# Patient Record
Sex: Male | Born: 1960
Health system: Southern US, Community
[De-identification: ages and names within clinical notes are randomized; demographics above are authoritative.]

## PROBLEM LIST (undated history)

## (undated) DIAGNOSIS — G4733 Obstructive sleep apnea (adult) (pediatric): Secondary | ICD-10-CM

## (undated) DIAGNOSIS — E785 Hyperlipidemia, unspecified: Secondary | ICD-10-CM

## (undated) DIAGNOSIS — I10 Essential (primary) hypertension: Secondary | ICD-10-CM

## (undated) DIAGNOSIS — Z8249 Family history of ischemic heart disease and other diseases of the circulatory system: Secondary | ICD-10-CM

## (undated) DIAGNOSIS — E669 Obesity, unspecified: Secondary | ICD-10-CM

## (undated) HISTORY — DX: Family history of ischemic heart disease and other diseases of the circulatory system: Z82.49

## (undated) HISTORY — DX: Hyperlipidemia, unspecified: E78.5

## (undated) HISTORY — DX: Obstructive sleep apnea (adult) (pediatric): G47.33

## (undated) HISTORY — DX: Obesity, unspecified: E66.9

## (undated) HISTORY — DX: Essential (primary) hypertension: I10

---

## 2010-11-30 HISTORY — PX: CARDIOVASCULAR STRESS TEST: SHX262

## 2010-11-30 HISTORY — PX: TRANSTHORACIC ECHOCARDIOGRAM: SHX275

## 2011-01-14 HISTORY — PX: OTHER SURGICAL HISTORY: SHX169

## 2012-09-27 ENCOUNTER — Ambulatory Visit (INDEPENDENT_AMBULATORY_CARE_PROVIDER_SITE_OTHER): Payer: BC Managed Care – PPO | Admitting: Cardiovascular Disease

## 2012-09-27 ENCOUNTER — Encounter: Payer: Self-pay | Admitting: Cardiovascular Disease

## 2012-09-27 VITALS — BP 110/70 | HR 79 | Ht 71.0 in | Wt 211.4 lb

## 2012-09-27 DIAGNOSIS — I10 Essential (primary) hypertension: Secondary | ICD-10-CM

## 2012-09-27 DIAGNOSIS — E782 Mixed hyperlipidemia: Secondary | ICD-10-CM

## 2012-09-27 DIAGNOSIS — R5381 Other malaise: Secondary | ICD-10-CM

## 2012-09-27 DIAGNOSIS — K219 Gastro-esophageal reflux disease without esophagitis: Secondary | ICD-10-CM

## 2012-09-27 DIAGNOSIS — R5383 Other fatigue: Secondary | ICD-10-CM

## 2012-09-27 DIAGNOSIS — G4733 Obstructive sleep apnea (adult) (pediatric): Secondary | ICD-10-CM

## 2012-09-27 LAB — COMPREHENSIVE METABOLIC PANEL
ALT: 14 U/L (ref 0–53)
Albumin: 4.4 g/dL (ref 3.5–5.2)
Alkaline Phosphatase: 48 U/L (ref 39–117)
Glucose, Bld: 94 mg/dL (ref 70–99)
Potassium: 4.8 mEq/L (ref 3.5–5.3)
Sodium: 139 mEq/L (ref 135–145)
Total Bilirubin: 0.7 mg/dL (ref 0.3–1.2)
Total Protein: 7.2 g/dL (ref 6.0–8.3)

## 2012-09-27 LAB — CBC
Hemoglobin: 13.9 g/dL (ref 13.0–17.0)
MCHC: 34.3 g/dL (ref 30.0–36.0)
Platelets: 276 10*3/uL (ref 150–400)
RBC: 4.52 MIL/uL (ref 4.22–5.81)

## 2012-09-27 LAB — T3, FREE: T3, Free: 3.2 pg/mL (ref 2.3–4.2)

## 2012-09-27 MED ORDER — IRBESARTAN 150 MG PO TABS: 150.0000 mg | ORAL_TABLET | Freq: Every day | ORAL | Status: DC

## 2012-09-27 NOTE — Progress Notes (Signed)
Patient ID: Brian Mcgee, male   DOB: 26-Aug-1960, 52 y.o.   MRN: [id number]     HPI: Brian Mcgee, is a 52 y.o. male who presents for a six-month cardiology evaluation.  [name] has a history of hypertension, mixed hyperlipidemia, strong family history for coronary artery disease, obstructive sleep apnea on CPAP therapy, as well as obesity. In addition, he was diagnosed with hypothyroidism earlier this year and has been on low-dose Synthroid replacement. He tells me over the past several months he is not doing quite as well. 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 He did was not using CPAP at that time. He also had previously lost significant amount of weight and also stopped taking his Nexium. Socially, he has noticed some potential dyspeptic symptoms. Also does note a dry hacking cough it is uncertain if this is related to his lisinopril or not taking the proton pump inhibitor. He certainly has gained back approximately 10 of the pounds that he had lost previously lost over 25 pounds. He does note fatigue. He does have a history of hypothyroidism and has been on Synthroid. He is now back using his CPAP therapy since his return from [place]. He is sleeping better. He presents for evaluation.  History reviewed. No pertinent past medical history.  History reviewed. No pertinent past surgical history.  Allergies  Allergen Reactions  . Penicillins Rash    Current Outpatient Prescriptions  Medication Sig Dispense Refill  . aspirin 81 MG tablet Take 81 mg by mouth daily.      Marland Kitchen levothyroxine (SYNTHROID, LEVOTHROID) 50 MCG tablet Take 50 mcg by mouth daily before breakfast.      . lisinopril (PRINIVIL,ZESTRIL) 20 MG tablet Take 20 mg by mouth daily.      . niacin-simvastatin (SIMCOR) 1000-20 MG 24 hr tablet Take 1 tablet by mouth at bedtime.      . irbesartan (AVAPRO) 150 MG tablet Take 1 tablet (150 mg total) by mouth at bedtime.  30 tablet  1   No current  facility-administered medications for this visit.    History   Social History  . Marital Status: Married    Spouse Name: N/A    Number of Children: N/A  . Years of Education: N/A   Occupational History  . Not on file.   Social History Main Topics  . Smoking status: Never Smoker   . Smokeless tobacco: Never Used  . Alcohol Use: Yes     Comment: socially  . Drug Use: Not on file  . Sexual Activity: Not on file   Other Topics Concern  . Not on file   Social History Narrative  . No narrative on file    Family History  Problem Relation Age of Onset  . Heart failure Father    Social history is notable in that he is married has 2 children. Is no tobacco or alcohol use.  ROS is negative for fevers, chills or night sweats.  He denies palpitations. He denies presyncope or syncope. He denies wheezing. He does admit to GERD. He also admits to a dry cough. He denies bleeding. He denies change in bowel or bladder habits. He denies claudication. He denies myalgias. Other system review is negative.  PE BP 110/70  Pulse 79  Ht 5\' 11"  (1.803 m)  Wt 211 lb 6.4 oz (95.89 kg)  BMI 29.5 kg/m2  General: Alert, oriented, no distress.  Skin: normal turgor, no rashes HEENT: Normocephalic,  atraumatic. Pupils round and reactive; sclera anicteric;no lid lag.  Nose without nasal septal hypertrophy Mouth/Parynx benign; Mallinpatti scale 3 Neck: No JVD, no carotid briuts Lungs: clear to ausculatation and percussion; no wheezing or rales Heart: RRR, s1 s2 normal 1/6 systolic murmur. Abdomen: Mild central adiposity soft, nontender; no hepatosplenomehaly, BS+; abdominal aorta nontender and not dilated by palpation. Pulses 2+ Extremities: no clubbing cyanosis or edema, Homan's sign negative  Neurologic: grossly nonfocal  ECG: Normal sinus rhythm at 79 beats per minute. Mild RV conduction delay; normal intervals.  LABS:  BMET No results found for this basename: na, k, cl, co2, glucose, bun,  creatinine, calcium, gfrnonaa, gfraa     Hepatic Function Panel  No results found for this basename: prot, albumin, ast, alt, alkphos, bilitot, bilidir, ibili     CBC No results found for this basename: wbc, rbc, hgb, hct, plt, mcv, mch, mchc, rdw, neutrabs, lymphsabs, monoabs, eosabs, basosabs     BNP No results found for this basename: probnp    Lipid Panel  No results found for this basename: chol, trig, hdl, cholhdl, vldl, ldlcalc     RADIOLOGY: No results found.    ASSESSMENT AND PLAN: [name] is a 52 year old gentleman who has a history of hypertension, mixed hyperlipidemia, hypothyroidism, family history for coronary artery disease as well as obstructive sleep apnea. He now is feeling better since reinstituting CPAP therapy since he did not use this when he had lost weight less than 200 pounds while he was in [place]. He again notes some reflux symptoms. I suggested he resume over-the-counter Nexium. I am recommending discontinuance of his lisinopril since this may be contributing to an ACE-induced cough. I will change this to irbesartan 150 mg daily. He is fasting today and I will check a CBC, comprehensive metabolic panel, NMR profile, TSH free T4 and vitamin D level.     Lennette Bihari, MD, [facility]  09/27/2012 11:35 AM

## 2012-09-27 NOTE — Patient Instructions (Addendum)
Your physician has recommended you make the following change in your medication: STOP the lisinopril. Start new prescription for Irbesartan 150 mg . This has already been sent to your pharmacy.  Your physician recommends that you return for lab work today. Your physician recommends that you schedule a follow-up appointment in: 6 MONTHS.

## 2012-09-28 LAB — NMR LIPOPROFILE WITH LIPIDS
Cholesterol, Total: 145 mg/dL (ref <200)
HDL Size: 8.4 nm — ABNORMAL LOW (ref >=9.2)
LDL (calc): 77 mg/dL (ref <100)
LDL Particle Number: 1055 nmol/L — ABNORMAL HIGH (ref <1000)
LP-IR Score: 52 — ABNORMAL HIGH (ref <=45)
Small LDL Particle Number: 448 nmol/L (ref <=527)
Triglycerides: 105 mg/dL (ref <150)
VLDL Size: 41.9 nm (ref <=46.6)

## 2012-10-04 ENCOUNTER — Encounter: Payer: Self-pay | Admitting: *Deleted

## 2012-10-04 NOTE — Progress Notes (Signed)
Quick Note:    Letter sent to patient.  ______

## 2012-11-22 ENCOUNTER — Other Ambulatory Visit: Payer: Self-pay | Admitting: *Deleted

## 2012-11-22 MED ORDER — IRBESARTAN 150 MG PO TABS: 150.0000 mg | ORAL_TABLET | Freq: Every day | ORAL | Status: DC

## 2012-11-22 NOTE — Telephone Encounter (Signed)
Rx sent to pharmacy electronically.   

## 2012-12-06 ENCOUNTER — Other Ambulatory Visit: Payer: Self-pay

## 2013-03-25 ENCOUNTER — Encounter: Payer: Self-pay | Admitting: Cardiovascular Disease

## 2013-03-25 ENCOUNTER — Ambulatory Visit (INDEPENDENT_AMBULATORY_CARE_PROVIDER_SITE_OTHER): Payer: BC Managed Care – PPO | Admitting: Cardiovascular Disease

## 2013-03-25 VITALS — BP 150/110 | HR 81 | Ht 71.0 in | Wt 233.5 lb

## 2013-03-25 DIAGNOSIS — I10 Essential (primary) hypertension: Secondary | ICD-10-CM

## 2013-03-25 DIAGNOSIS — E782 Mixed hyperlipidemia: Secondary | ICD-10-CM

## 2013-03-25 MED ORDER — IRBESARTAN 300 MG PO TABS: 300.0000 mg | ORAL_TABLET | Freq: Every day | ORAL | Status: DC

## 2013-03-25 NOTE — Patient Instructions (Signed)
Your physician has recommended you make the following change in your medication: increase the irbesartan from 150 mg to 300 mg.  Your physician recommends that you return for lab work in: 2-3 weeks fasting.  Your physician recommends that you schedule a follow-up appointment in: 6 months.

## 2013-03-29 ENCOUNTER — Encounter: Payer: Self-pay | Admitting: Cardiovascular Disease

## 2013-03-29 NOTE — Progress Notes (Signed)
Patient ID: Jasun Gasparini, male   DOB: 09-27-1960, 53 y.o.   MRN: [id number]     HPI: Kel Senn is a 53 y.o. male who presents for a six-month cardiology evaluation. I last saw in August 2014.  [name] has a history of hypertension, mixed hyperlipidemia, strong family history for coronary artery disease, obstructive sleep apnea on CPAP therapy, as well as obesity. In addition, he was diagnosed with hypothyroidism earlier this year and has been on low-dose Synthroid replacement. He tells me over the past several months he is not doing quite as well. 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 He did was not using CPAP at that time. He also had previously lost significant amount of weight and also stopped taking his Nexium. Socially, he has noticed some potential dyspeptic symptoms. Also does note a dry hacking cough it is uncertain if this is related to his lisinopril or not taking the proton pump inhibitor. He certainly has gained back approximately 10 of the pounds that he had lost previously lost over 25 pounds. He does note fatigue. He does have a history of hypothyroidism and has been on Synthroid. He is now back using his CPAP therapy since his return from [place]. He is sleeping better.  When I saw him in all he was experiencing an ACE-induced cough and I discontinued his lisinopril and changed him to irbesartan 150 mg daily. I also suggested he resume over-the-counter Nexium.  He did have followup laboratory and NMR profile yielded LDL particle number of 1055, calculated LDL cholesterol 77, HDL cholesterol 47, triglycerides 105, and total cholesterol 145 on his Simcor 1000/20 regimen. He did have increased insulin resistance. Had normal chemistry profile. TSH was borderline increased at 5.136.  He states recently, he has been using his CPAP. He has gained weight. He has not been routinely exercising. He presents for evaluation.  Past Medical History  Diagnosis  Date  . Hypertension   . Hyperlipidemia   . Family history of heart disease   . OSA (obstructive sleep apnea)     on CPAP  . Obesity     Past Surgical History  Procedure Laterality Date  . Sleep study interpretation  01/14/2011    AHI-30.7/hr, AHI during REM-5.7/hr, RDI-30.7/hr, RDI during REM-5.7/hr, average oxygen saturation during REM and NREM-93%, lowest oxygen saturation during NREM-81%, lowest oxygen saturation during REM-85%,   . Cardiovascular stress test  11/30/2010    No scintigraphic evidence of inducible myocardial ischemia. EKG negative for ischemia. No ECG changes.  . Transthoracic echocardiogram  11/30/2010    EF >55%, normal LV systolic function, normal RV systolic function    Allergies  Allergen Reactions  . Penicillins Rash    Current Outpatient Prescriptions  Medication Sig Dispense Refill  . aspirin 81 MG tablet Take 81 mg by mouth daily.      Marland Kitchen levothyroxine (SYNTHROID, LEVOTHROID) 50 MCG tablet Take 50 mcg by mouth daily before breakfast.      . niacin-simvastatin (SIMCOR) 1000-20 MG 24 hr tablet Take 1 tablet by mouth at bedtime.      Marland Kitchen OVER THE COUNTER MEDICATION CPAP therapy      . irbesartan (AVAPRO) 300 MG tablet Take 1 tablet (300 mg total) by mouth daily.  90 tablet  3   No current facility-administered medications for this visit.    History   Social History  . Marital Status: Married    Spouse Name: N/A  Number of Children: N/A  . Years of Education: N/A   Occupational History  . Not on file.   Social History Main Topics  . Smoking status: Never Smoker   . Smokeless tobacco: Never Used  . Alcohol Use: Yes     Comment: socially  . Drug Use: Not on file  . Sexual Activity: Not on file   Other Topics Concern  . Not on file   Social History Narrative  . No narrative on file    Family History  Problem Relation Age of Onset  . Heart failure Father   . Heart disease Father     CABG  . Hypertension Father   . Diabetes Father       Type 2 diabetes  . Alzheimer's disease Mother   . Hypertension Mother   . Diabetes Mother     Type 2 diabetes  . Hypertension Sister   . Aneurysm Maternal Grandmother   . Hypertension Maternal Grandmother   . Heart disease Maternal Grandfather     CABG  . Cancer Maternal Grandfather     Colon cancer & Leukemia  . Diabetes Maternal Grandfather     Type 2 diabetes  . Hypertension Maternal Grandfather   . Hypertension Paternal Grandmother   . Diabetes Paternal Grandmother     Type 1 diabetes  . Hernia Paternal Grandmother   . Heart attack Paternal Grandmother   . Cancer Paternal Grandfather     Lung cancer   Social history is notable in that he is married has 2 children. Is no tobacco or alcohol use.  ROS is negative for fevers, chills or night sweats. There is a 22 pound weight gain from when he initially lost weight. He denies change in vision or hearing.  He denies palpitations. He denies presyncope or syncope. He denies wheezing.  Rizzo chest tightness He does admit to GERD. His dry cough improved with discontinuing his ACE inhibition and changed to ARB therapy.  He denies bleeding. He denies change in bowel or bladder habits. He denies claudication. He denies myalgias. Other  comprehensive 14 system review is negative.  PE BP 150/110  Pulse 81  Ht 5\' 11"  (1.803 m)  Wt 233 lb 8 oz (105.915 kg)  BMI 32.58 kg/m2  Repeat blood pressure 130/90  General: Alert, oriented, no distress.  Skin: normal turgor, no rashes HEENT: Normocephalic, atraumatic. Pupils round and reactive; sclera anicteric;no lid lag.  Nose without nasal septal hypertrophy Mouth/Parynx benign; Mallinpatti scale 3 Neck: No JVD, no carotid  with normal carotid upstroke  Lungs: clear to ausculatation and percussion; no wheezing or rales Heart: RRR, s1 s2 normal 1/6 systolic murmur.No diastolic murmur. No S3 or S4 gallop. No rubs thrills or heaves  Abdomen: Mild central adiposity soft, nontender; no  hepatosplenomehaly, BS+; abdominal aorta nontender and not dilated by palpation. Back: No CVA tenderness  Pulses 2+ Extremities: no clubbing cyanosis or edema, Homan's sign negative  Neurologic: grossly nonfocal Psychological: Normal affect and mood  ECG(independently read by me): Normal sinus rhythm 81 beats per minute. Normal intervals  Prior ECG from 09/27/2012: Normal sinus rhythm at 79 beats per minute. Mild RV conduction delay; normal intervals.  LABS:  BMET    Component Value Date/Time   NA 139 09/27/2012 1137     Hepatic Function Panel     Component Value Date/Time   PROT 7.2 09/27/2012 1137     CBC    Component Value Date/Time   WBC 5.4 09/27/2012 1137  BNP No results found for this basename: probnp    Lipid Panel  No results found for this basename: chol,  trig,  hdl,  cholhdl,  vldl,  ldlcalc     RADIOLOGY: No results found.    ASSESSMENT AND PLAN: Mr. Sherre ScarletHudgins is a 53 year old gentleman who has a history of hypertension, mixed hyperlipidemia, hypothyroidism, family history for coronary artery disease as well as obstructive sleep apnea. He does have documented insulin resistance and metabolic syndrome, and had lost significant weight. Unfortunately he's gained approximately 22 pounds back. He is using his CPAP therapy. His blood pressure today is elevated on his current dose of irbesartan 150 mg. I will further titrate this to 300 mg daily. He will have followup lab work in several weeks. I discussed importance of increased exercise and weight loss. He will continue to use CPAP at percent of the time. He is not having palpitations or chest pain. His GERD has improved with resumption of proton pump inhibition. There are no signs of edema. I will see him in 6 months for cardiology reevaluation.  Lennette Biharihomas A. Kelly, MD, [facility]  03/29/2013 9:04 AM

## 2013-04-09 LAB — LIPID PANEL
CHOLESTEROL: 155 mg/dL (ref 0–200)
HDL: 45 mg/dL (ref >39)
LDL CALC: 76 mg/dL (ref 0–99)
TRIGLYCERIDES: 169 mg/dL — AB (ref <150)
Total CHOL/HDL Ratio: 3.4 Ratio
VLDL: 34 mg/dL (ref 0–40)

## 2013-04-09 LAB — COMPREHENSIVE METABOLIC PANEL
ALBUMIN: 4.2 g/dL (ref 3.5–5.2)
ALT: 17 U/L (ref 0–53)
AST: 18 U/L (ref 0–37)
Alkaline Phosphatase: 56 U/L (ref 39–117)
BUN: 19 mg/dL (ref 6–23)
CALCIUM: 9.3 mg/dL (ref 8.4–10.5)
CHLORIDE: 104 meq/L (ref 96–112)
CO2: 30 mEq/L (ref 19–32)
Creat: 0.96 mg/dL (ref 0.50–1.35)
GLUCOSE: 98 mg/dL (ref 70–99)
POTASSIUM: 4.1 meq/L (ref 3.5–5.3)
Sodium: 144 mEq/L (ref 135–145)
Total Bilirubin: 0.7 mg/dL (ref 0.2–1.2)
Total Protein: 6.9 g/dL (ref 6.0–8.3)

## 2013-04-10 LAB — TSH: TSH: 10.632 u[IU]/mL — ABNORMAL HIGH (ref 0.350–4.500)

## 2013-04-17 ENCOUNTER — Telehealth: Payer: Self-pay | Admitting: Cardiovascular Disease

## 2013-04-17 NOTE — Telephone Encounter (Signed)
Patient was sen today. Results were discussed @ visit.

## 2013-04-17 NOTE — Telephone Encounter (Signed)
Results have not been reviewed by MD.  Message forwarded to Dr. Pierre BaliKelly/Wanda, CMA.

## 2013-04-17 NOTE — Telephone Encounter (Signed)
Please call,wants to discuss his lab results that he saw on my-chart.

## 2013-04-26 NOTE — Telephone Encounter (Signed)
Returned a call to patient. Discussed his TSH results. Patient says that he saw it in my chart but he did not see Dr. Landry DykeKelly's recommendations. iverbally gave patient the results and recommendations. He will use the levothyroxine that he has on hand to equal the increased dose.  He will call when he  needs a new prescription. Patient requests a testosterone level as well due to fatique. I explained to him that Dr. Tresa EndoKelly doesn't usually follow this if it is abnormal. I will ask Dr. Tresa EndoKelly if he will order this per patient request. If so, then I will order and send to patient. Patient is aware that Dr. Tresa EndoKelly will be out of the office until Next Wednesday.

## 2013-04-26 NOTE — Telephone Encounter (Signed)
Please call-still concerned because he still have not gotten his lab results.he saw his results in my chart,but been trying to discuss it with somebody.

## 2013-04-29 ENCOUNTER — Telehealth: Payer: Self-pay | Admitting: *Deleted

## 2013-04-29 DIAGNOSIS — E039 Hypothyroidism, unspecified: Secondary | ICD-10-CM

## 2013-04-29 NOTE — Telephone Encounter (Signed)
Lab order to have TSH drawn in for weeks sent to patient.

## 2013-04-30 ENCOUNTER — Telehealth: Payer: Self-pay | Admitting: Cardiovascular Disease

## 2013-04-30 NOTE — Telephone Encounter (Signed)
Need his prescription faxed or sent by e-script for his Irbesartan 300 mg #90 and 3 refills please.[facility]

## 2013-04-30 NOTE — Telephone Encounter (Signed)
Pt. Was given paper copy of prescription to [facility] into [facility] himself, per patient request. Patient was confused on why he had not received his medication. Patient was informed he had to mail the prescription to [facility] in order to receive the medication. Patient will take printed prescription to pharmacy to be filled.

## 2013-05-28 LAB — TSH: TSH: 8.524 u[IU]/mL — ABNORMAL HIGH (ref 0.350–4.500)

## 2013-05-29 ENCOUNTER — Encounter: Payer: Self-pay | Admitting: *Deleted

## 2013-06-04 ENCOUNTER — Telehealth: Payer: Self-pay | Admitting: Cardiovascular Disease

## 2013-06-04 ENCOUNTER — Other Ambulatory Visit: Payer: Self-pay | Admitting: *Deleted

## 2013-06-04 MED ORDER — LEVOTHYROXINE SODIUM 88 MCG PO TABS: 88.0000 ug | ORAL_TABLET | Freq: Every day | ORAL | Status: DC

## 2013-06-04 MED ORDER — LEVOTHYROXINE SODIUM 88 MCG PO TABS
88.0000 ug | ORAL_TABLET | Freq: Every day | ORAL | Status: DC
Start: 2013-06-04 — End: 2013-06-04

## 2013-06-04 NOTE — Telephone Encounter (Signed)
Returned call and pt verified x 2.  Pt informed message received and RN reviewed chart.  Informed his note to Burna MortimerWanda stated he was taking 75 mcg daily and informed Dr. Tresa EndoKelly wants to increase to 88 mcg (0.088 mg) daily.  Informed RN will send in script.  [facility]  Rx sent to pharmacy w/ one refill.   Pt informed Burna MortimerWanda will be notified to discuss w/ Dr. Tresa EndoKelly if/when lab to be repeated.  Pt verbalized understanding and agreed w/ plan.    Message forwarded to [place], [place].

## 2013-06-04 NOTE — Telephone Encounter (Signed)
Call to pharmacy and informed script not on file.  VO given to Tammy, pharmacist.    Call to pt and informed script was sent w/ confirmation, but pharmacy said they didn't receive it.  Informed VO given and it should be ready soon.  Pt verbalized understanding and agreed w/ plan.

## 2013-06-04 NOTE — Telephone Encounter (Signed)
Thanks. No I had not seen his message due to being in clinic the last 4 days. I will call him in reference to his follow up lab.

## 2013-06-04 NOTE — Telephone Encounter (Signed)
He had e mailed Brian Mcgee a response regarding her email regarding increasing dose of levothyrox  And he has not heard back from her since 4/29  Only has 1 day left.  Needs this refilled (and/or increased per Dr Tresa EndoKelly)  Please call

## 2013-06-04 NOTE — Telephone Encounter (Signed)
Brian Mcgee is at the pharmacy and they are saying that they did not receive the prescription refill . Where it shows that receipt confirm by pharmacy... Please call the pharmacy .Marland Kitchen. [phone]   Thanks

## 2013-06-14 ENCOUNTER — Other Ambulatory Visit: Payer: Self-pay | Admitting: Cardiovascular Disease

## 2013-06-14 NOTE — Telephone Encounter (Signed)
Rx refill sent to patient pharmacy   

## 2013-07-16 ENCOUNTER — Other Ambulatory Visit: Payer: Self-pay | Admitting: *Deleted

## 2013-07-16 ENCOUNTER — Other Ambulatory Visit: Payer: Self-pay

## 2013-07-16 ENCOUNTER — Telehealth: Payer: Self-pay | Admitting: Cardiovascular Disease

## 2013-07-16 MED ORDER — IRBESARTAN 300 MG PO TABS: 300.0000 mg | ORAL_TABLET | Freq: Every day | ORAL | Status: DC

## 2013-07-16 NOTE — Telephone Encounter (Signed)
Pt med refilled 

## 2013-07-16 NOTE — Telephone Encounter (Signed)
Rx was sent to pharmacy electronically. 

## 2013-07-16 NOTE — Telephone Encounter (Signed)
Refill complete 

## 2013-07-16 NOTE — Telephone Encounter (Signed)
Pt wants to make sure you received a fax for his generic Avapro. Please let him know if you have and please take care of this today for him if possible.

## 2013-07-23 ENCOUNTER — Telehealth: Payer: Self-pay | Admitting: Cardiovascular Disease

## 2013-07-23 ENCOUNTER — Other Ambulatory Visit: Payer: Self-pay | Admitting: *Deleted

## 2013-07-23 MED ORDER — LEVOTHYROXINE SODIUM 88 MCG PO TABS: 88.0000 ug | ORAL_TABLET | Freq: Every day | ORAL | Status: DC

## 2013-07-23 NOTE — Telephone Encounter (Signed)
Rx refill sent to patient pharmacy electronically  

## 2013-07-23 NOTE — Telephone Encounter (Signed)
Need refill on his Levothyroxine 88 micrigrams #30.

## 2013-07-24 ENCOUNTER — Telehealth: Payer: Self-pay | Admitting: Cardiovascular Disease

## 2013-07-24 ENCOUNTER — Other Ambulatory Visit: Payer: Self-pay | Admitting: *Deleted

## 2013-07-24 MED ORDER — LEVOTHYROXINE SODIUM 88 MCG PO TABS: 88.0000 ug | ORAL_TABLET | Freq: Every day | ORAL | Status: DC

## 2013-07-24 NOTE — Telephone Encounter (Signed)
Rx was sent to pharmacy electronically. 

## 2013-07-24 NOTE — Telephone Encounter (Signed)
Pt just called and said he already cancelled the order at [facility].

## 2013-07-24 NOTE — Telephone Encounter (Signed)
His Levothyroxine was [facility] by mistake.Brian Mcgee. He need you to call and cancel that first,can not get it until this happen.Please take care of this today,pt is going out of town tomorrow. Please [phone]

## 2013-08-22 ENCOUNTER — Other Ambulatory Visit: Payer: Self-pay

## 2013-08-22 MED ORDER — LEVOTHYROXINE SODIUM 88 MCG PO TABS: 88.0000 ug | ORAL_TABLET | Freq: Every day | ORAL | Status: DC

## 2013-08-22 NOTE — Telephone Encounter (Signed)
Rx was sent to pharmacy electronically. 

## 2014-01-21 ENCOUNTER — Encounter: Payer: Self-pay | Admitting: Cardiovascular Disease

## 2014-02-24 ENCOUNTER — Ambulatory Visit: Payer: BC Managed Care – PPO | Admitting: Cardiovascular Disease

## 2014-03-06 ENCOUNTER — Telehealth: Payer: Self-pay | Admitting: Cardiovascular Disease

## 2014-03-06 NOTE — Telephone Encounter (Signed)
Confirmed appt for 03/21/14 as reschedule for inclement weather cancellation.  Pt wanted to know if endocrinology referral would be appropriate for his thyroid management. TSH drawn in April 2015 was elevated, I do not see more recent results for this though.   Currently on 88mcg Synthroid daily.  Questions for Dr. Tresa EndoKelly: Does patient need pre-OV labwork ordered for upcoming appt here? Do you want to refer to endocrinology?

## 2014-03-06 NOTE — Telephone Encounter (Signed)
Mr.Hargett was schedule to come in to see Dr. Tresa EndoKelly on 02/24/14 , but the office was closed due to inclement weather , Mr Baruch GoutyHudgens is concerned that Dr. Tresa EndoKelly wanted to change his thyroid medication . Please call    Thanks

## 2014-03-07 NOTE — Telephone Encounter (Signed)
Can assess when pt is seen in office and f/u labs are obtained

## 2014-03-10 MED ORDER — LEVOTHYROXINE SODIUM 88 MCG PO TABS: 88.0000 ug | ORAL_TABLET | Freq: Every day | ORAL | Status: DC

## 2014-03-10 NOTE — Telephone Encounter (Signed)
Pt voiced understanding, requested reorder for synthroid, 30 day supply sent to pharmacy of preference.

## 2014-03-21 ENCOUNTER — Ambulatory Visit (INDEPENDENT_AMBULATORY_CARE_PROVIDER_SITE_OTHER): Payer: BLUE CROSS/BLUE SHIELD | Admitting: Cardiovascular Disease

## 2014-03-21 VITALS — BP 130/100 | HR 69 | Ht 71.0 in | Wt 228.5 lb

## 2014-03-21 DIAGNOSIS — I10 Essential (primary) hypertension: Secondary | ICD-10-CM

## 2014-03-21 DIAGNOSIS — G4733 Obstructive sleep apnea (adult) (pediatric): Secondary | ICD-10-CM

## 2014-03-21 DIAGNOSIS — E039 Hypothyroidism, unspecified: Secondary | ICD-10-CM

## 2014-03-21 DIAGNOSIS — E785 Hyperlipidemia, unspecified: Secondary | ICD-10-CM

## 2014-03-21 DIAGNOSIS — K219 Gastro-esophageal reflux disease without esophagitis: Secondary | ICD-10-CM

## 2014-03-21 DIAGNOSIS — Z9989 Dependence on other enabling machines and devices: Secondary | ICD-10-CM

## 2014-03-21 DIAGNOSIS — Z79899 Other long term (current) drug therapy: Secondary | ICD-10-CM

## 2014-03-21 DIAGNOSIS — E782 Mixed hyperlipidemia: Secondary | ICD-10-CM

## 2014-03-21 NOTE — Patient Instructions (Addendum)
Your physician wants you to follow-up in 6 months with Dr. Tresa EndoKelly. You will receive a reminder letter in the mail 2 months in advance. If you do not receive a letter, please call our office to schedule the follow-up appointment.  Dr. Tresa EndoKelly has ordered for you to have lab work done in the near future and you MUST be FASTING.

## 2014-03-23 ENCOUNTER — Encounter: Payer: Self-pay | Admitting: Cardiovascular Disease

## 2014-03-23 NOTE — Progress Notes (Signed)
Patient ID: Brian Mcgee, male   DOB: 02-02-1960, 54 y.o.   MRN: [id number]     HPI: Brian Mcgee is a 55 y.o. male who presents for a one year cardiology evaluation.   [name] has a history of hypertension, mixed hyperlipidemia, strong family history for coronary artery disease, obstructive sleep apnea on CPAP therapy, as well as obesity. In addition, he was diagnosed with hypothyroidism last year and has been on  Synthroid replacement.  He developed an ACE-induced cough and lisinopril and changed him to irbesartan. An NMR profile last year yielded LDL particle number of 1055, calculated LDL cholesterol 77, HDL cholesterol 47, triglycerides 105, and total cholesterol 145 on his Simcor 1000/20 regimen. He did have increased insulin resistance. Had normal chemistry profile. TSH was borderline increased at 5.136.  Over the past year, he admits that he is not sleeping very well.  He is an Agricultural consultant.  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  He has been under significant increased stress.  He admits to using CPAP therapy.  Remotely, he had lost a significant bout of weight but over the past year, his weight had risen to 245 pounds.  Since that level.  He has lost weight 228 pounds.  He has not had any recent laboratory assessed. He takes Nexium for GERD.  He currently is on irbesartan 300 mg daily for hypertension.Marland Kitchen He continues to be on Simcor 1000/20 for his mixed hyperlipidemia.  He has been on levothyroxine 88 g for hypothyroidism.   Past Medical History  Diagnosis Date  . Hypertension   . Hyperlipidemia   . Family history of heart disease   . OSA (obstructive sleep apnea)     on CPAP  . Obesity     Past Surgical History  Procedure Laterality Date  . Sleep study interpretation  01/14/2011    AHI-30.7/hr, AHI during REM-5.7/hr, RDI-30.7/hr, RDI during REM-5.7/hr, average oxygen saturation during REM and NREM-93%, lowest oxygen saturation during NREM-81%, lowest oxygen  saturation during REM-85%,   . Cardiovascular stress test  11/30/2010    No scintigraphic evidence of inducible myocardial ischemia. EKG negative for ischemia. No ECG changes.  . Transthoracic echocardiogram  11/30/2010    EF >55%, normal LV systolic function, normal RV systolic function    Allergies  Allergen Reactions  . Penicillins Rash    Current Outpatient Prescriptions  Medication Sig Dispense Refill  . aspirin 81 MG tablet Take 81 mg by mouth daily.    Marland Kitchen esomeprazole (NEXIUM) 20 MG capsule Take 20 mg by mouth daily at 12 noon.    . irbesartan (AVAPRO) 300 MG tablet Take 1 tablet (300 mg total) by mouth daily. 90 tablet 3  . levothyroxine (SYNTHROID, LEVOTHROID) 88 MCG tablet Take 1 tablet (88 mcg total) by mouth daily before breakfast. 30 tablet 0  . OVER THE COUNTER MEDICATION CPAP therapy    . SIMCOR 1000-20 MG 24 hr tablet TAKE 1 BY MOUTH AT BEDTIME 90 tablet 2   No current facility-administered medications for this visit.    History   Social History  . Marital Status: Married    Spouse Name: N/A  . Number of Children: N/A  . Years of Education: N/A   Occupational History  . Not on file.   Social History Main Topics  . Smoking status: Never Smoker   . Smokeless tobacco: Never Used  . Alcohol Use: Yes     Comment: socially  . Drug Use: Not on file  .  Sexual Activity: Not on file   Other Topics Concern  . Not on file   Social History Narrative    Family History  Problem Relation Age of Onset  . Heart failure Father   . Heart disease Father     CABG  . Hypertension Father   . Diabetes Father     Type 2 diabetes  . Alzheimer's disease Mother   . Hypertension Mother   . Diabetes Mother     Type 2 diabetes  . Hypertension Sister   . Aneurysm Maternal Grandmother   . Hypertension Maternal Grandmother   . Heart disease Maternal Grandfather     CABG  . Cancer Maternal Grandfather     Colon cancer & Leukemia  . Diabetes Maternal Grandfather      Type 2 diabetes  . Hypertension Maternal Grandfather   . Hypertension Paternal Grandmother   . Diabetes Paternal Grandmother     Type 1 diabetes  . Hernia Paternal Grandmother   . Heart attack Paternal Grandmother   . Cancer Paternal Grandfather     Lung cancer   Social history is notable in that he is married has 2 children. Is no tobacco or alcohol use.  ROS General: Positive for weight fluctuation; No fevers, chills, or night sweats;  HEENT: Negative; No changes in vision or hearing, sinus congestion, difficulty swallowing Pulmonary: Negative; No cough, wheezing, shortness of breath, hemoptysis Cardiovascular: Negative; No chest pain, presyncope, syncope, palpitations GI: Negative; No nausea, vomiting, diarrhea, or abdominal pain GU: Negative; No dysuria, hematuria, or difficulty voiding Musculoskeletal: Negative; no myalgias, joint pain, or weakness Hematologic/Oncology: Negative; no easy bruising, bleeding Endocrine: Positive for hypothyroidism Neuro: Negative; no changes in balance, headaches Skin: Negative; No rashes or skin lesions Psychiatric: Negative; No behavioral problems, depression Sleep: Positive for obstructive sleep apnea on CPAP No snoring, daytime sleepiness, hypersomnolence, bruxism, restless legs, hypnogognic hallucinations, no cataplexy Other comprehensive 14 point system review is negative.  PE BP 130/100 mmHg  Pulse 69  Ht '5\' 11"'  (1.803 m)  Wt 228 lb 8 oz (103.647 kg)  BMI 31.88 kg/m2  Repeat blood pressure 122/80 when taken by me. General: Alert, oriented, no distress.  Skin: normal turgor, no rashes HEENT: Normocephalic, atraumatic. Pupils round and reactive; sclera anicteric;no lid lag.  Nose without nasal septal hypertrophy Mouth/Parynx benign; Mallinpatti scale 3 Neck: No JVD, no carotid  with normal carotid upstroke  Lungs: clear to ausculatation and percussion; no wheezing or rales Heart: RRR, s1 s2 normal 1/6 systolic murmur.No diastolic  murmur. No S3 or S4 gallop. No rubs thrills or heaves  Abdomen: Mild central adiposity soft, nontender; no hepatosplenomehaly, BS+; abdominal aorta nontender and not dilated by palpation. Back: No CVA tenderness  Pulses 2+ Extremities: no clubbing cyanosis or edema, Homan's sign negative  Neurologic: grossly nonfocal Psychological: Normal affect and mood  ECG(independently read by me): Normal sinus rhythm with mild sinus arrhythmia.  Heart rate 69 bpm.  Normal intervals.  February 2015 ECG(independently read by me): Normal sinus rhythm 81 beats per minute. Normal intervals  Prior ECG from 09/27/2012: Normal sinus rhythm at 79 beats per minute. Mild RV conduction delay; normal intervals.  LABS:  BMET  BMP Latest Ref Rng 04/09/2013 09/27/2012  Glucose 70 - 99 mg/dL 98 94  BUN 6 - 23 mg/dL 19 13  Creatinine 0.50 - 1.35 mg/dL 0.96 1.04  Sodium 135 - 145 mEq/L 144 139  Potassium 3.5 - 5.3 mEq/L 4.1 4.8  Chloride 96 - 112 mEq/L 104 104  CO2 19 - 32 mEq/L 30 27  Calcium 8.4 - 10.5 mg/dL 9.3 9.5     Hepatic Function Panel     Component Value Date/Time   PROT 6.9 04/09/2013 1109   Hepatic Function Latest Ref Rng 04/09/2013 09/27/2012  Total Protein 6.0 - 8.3 g/dL 6.9 7.2  Albumin 3.5 - 5.2 g/dL 4.2 4.4  AST 0 - 37 U/L 18 14  ALT 0 - 53 U/L 17 14  Alk Phosphatase 39 - 117 U/L 56 48  Total Bilirubin 0.2 - 1.2 mg/dL 0.7 0.7     CBC  CBC Latest Ref Rng 09/27/2012  WBC 4.0 - 10.5 K/uL 5.4  Hemoglobin 13.0 - 17.0 g/dL 13.9  Hematocrit 39.0 - 52.0 % 40.5  Platelets 150 - 400 K/uL 276      BNP No results found for: PROBNP   Lipid Panel     Component Value Date/Time   CHOL 155 04/09/2013 1109   TRIG 169* 04/09/2013 1109   TRIG 105 09/27/2012 1137   HDL 45 04/09/2013 1109   CHOLHDL 3.4 04/09/2013 1109   VLDL 34 04/09/2013 1109   LDLCALC 76 04/09/2013 1109   LDLCALC 77 09/27/2012 1137     RADIOLOGY: No results found.    ASSESSMENT AND PLAN: [name] is a  54 year old gentleman who has a history of hypertension, mixed hyperlipidemia, hypothyroidism, family history for coronary artery disease as well as obstructive sleep apnea. He does have documented insulin resistance and metabolic syndrome.  Previously he had lost a significant amount weight, but over the past year, had regained weight and now has started to lose weight again.  His blood pressure is mildly labile, but on repeat by me was normal on his current dose of irbesartan 300 mg daily.  He has not had laboratory in a year.  A complete set of blood work will be obtained including TSH, free T3, free T4, CMET, lipid panel, CBC, and vitamin D level.  Adjustments will be made to his medical regimen if necessary He continues to use CPAP therapy for his obstructive sleep apnea.  It may be worthwhile to have a new download of his device to make certain inadequate therapy is not contributing to his difficulty with sleep.  He may require additional titration of thyroid medicine.  If TSH is low.  We will contact him regarding the results.  I will see him in 6 months for reevaluation or sooner from his arise.  Time spent: 25 minutes  Troy Sine, MD, [facility]  03/23/2014 10:32 AM

## 2014-03-25 ENCOUNTER — Telehealth: Payer: Self-pay | Admitting: *Deleted

## 2014-03-25 LAB — COMPREHENSIVE METABOLIC PANEL
ALT: 14 U/L (ref 0–53)
AST: 16 U/L (ref 0–37)
Albumin: 4.1 g/dL (ref 3.5–5.2)
Alkaline Phosphatase: 53 U/L (ref 39–117)
BILIRUBIN TOTAL: 0.6 mg/dL (ref 0.2–1.2)
BUN: 15 mg/dL (ref 6–23)
CO2: 22 mEq/L (ref 19–32)
CREATININE: 0.83 mg/dL (ref 0.50–1.35)
Calcium: 9 mg/dL (ref 8.4–10.5)
Chloride: 107 mEq/L (ref 96–112)
GLUCOSE: 110 mg/dL — AB (ref 70–99)
POTASSIUM: 4.2 meq/L (ref 3.5–5.3)
Sodium: 140 mEq/L (ref 135–145)
Total Protein: 6.7 g/dL (ref 6.0–8.3)

## 2014-03-25 LAB — CBC WITH DIFFERENTIAL/PLATELET
BASOS PCT: 0 % (ref 0–1)
Basophils Absolute: 0 10*3/uL (ref 0.0–0.1)
Eosinophils Absolute: 0.1 10*3/uL (ref 0.0–0.7)
Eosinophils Relative: 2 % (ref 0–5)
HCT: 43.6 % (ref 39.0–52.0)
Hemoglobin: 14.9 g/dL (ref 13.0–17.0)
LYMPHS PCT: 41 % (ref 12–46)
Lymphs Abs: 2 10*3/uL (ref 0.7–4.0)
MCH: 30.8 pg (ref 26.0–34.0)
MCHC: 34.2 g/dL (ref 30.0–36.0)
MCV: 90.3 fL (ref 78.0–100.0)
MONO ABS: 0.4 10*3/uL (ref 0.1–1.0)
MPV: 9.7 fL (ref 8.6–12.4)
Monocytes Relative: 9 % (ref 3–12)
NEUTROS ABS: 2.3 10*3/uL (ref 1.7–7.7)
NEUTROS PCT: 48 % (ref 43–77)
PLATELETS: 250 10*3/uL (ref 150–400)
RBC: 4.83 MIL/uL (ref 4.22–5.81)
RDW: 14 % (ref 11.5–15.5)
WBC: 4.8 10*3/uL (ref 4.0–10.5)

## 2014-03-25 LAB — LIPID PANEL
CHOLESTEROL: 160 mg/dL (ref 0–200)
HDL: 48 mg/dL (ref >=40)
LDL Cholesterol: 88 mg/dL (ref 0–99)
Total CHOL/HDL Ratio: 3.3 Ratio
Triglycerides: 121 mg/dL (ref <150)
VLDL: 24 mg/dL (ref 0–40)

## 2014-03-25 LAB — T4, FREE: FREE T4: 1.09 ng/dL (ref 0.80–1.80)

## 2014-03-25 LAB — TSH: TSH: 6.743 u[IU]/mL — ABNORMAL HIGH (ref 0.350–4.500)

## 2014-03-25 LAB — T3, FREE: T3 FREE: 3.2 pg/mL (ref 2.3–4.2)

## 2014-03-25 MED ORDER — NIACIN-SIMVASTATIN ER 1000-20 MG PO TB24
ORAL_TABLET | ORAL | Status: DC
Start: 2014-03-25 — End: 2014-06-25

## 2014-03-25 MED ORDER — NIACIN-SIMVASTATIN ER 1000-20 MG PO TB24: ORAL_TABLET | ORAL | Status: DC

## 2014-03-25 NOTE — Telephone Encounter (Signed)
Spoke with pt, he is needing a temp refill at a local pharmacy for simcor. He also reports the tier level has changed with [facility]. He reports he brought something here in December but has not heard. No notations in the patient chart, will send a script to [facility] and wait to hear from the company. Pt agreed with this plan.

## 2014-03-26 LAB — VITAMIN D 25 HYDROXY (VIT D DEFICIENCY, FRACTURES): Vit D, 25-Hydroxy: 24 ng/mL — ABNORMAL LOW (ref 30–100)

## 2014-04-01 ENCOUNTER — Telehealth: Payer: Self-pay | Admitting: *Deleted

## 2014-04-01 DIAGNOSIS — E039 Hypothyroidism, unspecified: Secondary | ICD-10-CM

## 2014-04-01 DIAGNOSIS — R899 Unspecified abnormal finding in specimens from other organs, systems and tissues: Secondary | ICD-10-CM

## 2014-04-01 MED ORDER — LEVOTHYROXINE SODIUM 100 MCG PO TABS: 100.0000 ug | ORAL_TABLET | Freq: Every day | ORAL | Status: DC

## 2014-04-01 NOTE — Telephone Encounter (Signed)
-----   Message from Lennette Biharihomas A Kelly, MD sent at 03/31/2014 11:46 AM EST ----- Increase synthroid from .088 to .1 mg with TSH better but  Still slightly increased; re check in 4 weeks.

## 2014-04-01 NOTE — Telephone Encounter (Signed)
-----   Message from Thomas A Kelly, MD sent at 03/31/2014 11:46 AM EST ----- Increase synthroid from .088 to .1 mg with TSH better but  Still slightly increased; re check in 4 weeks. 

## 2014-04-01 NOTE — Telephone Encounter (Signed)
Patient notified of lab results and recommendations. [facility]. Follow up TSH ordered and slip mailed to patient.

## 2014-05-21 LAB — TSH: TSH: 6.502 u[IU]/mL — ABNORMAL HIGH (ref 0.350–4.500)

## 2014-05-27 ENCOUNTER — Telehealth: Payer: Self-pay | Admitting: *Deleted

## 2014-05-27 DIAGNOSIS — E039 Hypothyroidism, unspecified: Secondary | ICD-10-CM

## 2014-05-27 MED ORDER — LEVOTHYROXINE SODIUM 125 MCG PO TABS: 125.0000 ug | ORAL_TABLET | Freq: Every day | ORAL | Status: DC

## 2014-05-27 NOTE — Telephone Encounter (Signed)
-----   Message from Lennette Biharihomas A Kelly, MD sent at 05/26/2014  5:21 PM EDT ----- Inc synthroid to 125 ug daily if on 100 u; f/u tsh in 4 weeks

## 2014-05-27 NOTE — Telephone Encounter (Signed)
Informed patient of TSH results and recommendations. Patient voiced understanding. Repeat TSH [facility]rox[facility] .[facility] mcg [facility] [address].

## 2014-06-23 ENCOUNTER — Telehealth: Payer: Self-pay | Admitting: Cardiovascular Disease

## 2014-06-23 NOTE — Telephone Encounter (Signed)
Please call,Simcor have been discontinued.He will getting two medicine to replace this one.

## 2014-06-23 NOTE — Telephone Encounter (Signed)
Please advise for simcor replacement - simvastatin 20mg  & niaspan 1000mg  daily OK?

## 2014-06-24 NOTE — Telephone Encounter (Signed)
Pt says he was still waiting to hear something.

## 2014-06-25 MED ORDER — SIMVASTATIN 40 MG PO TABS: 40.0000 mg | ORAL_TABLET | Freq: Every day | ORAL | Status: DC

## 2014-06-25 NOTE — Telephone Encounter (Signed)
Called patient to give instruction on med change. He acknowledged understanding, requested script to be submitted to [facility]. I informed this was done. No further concerns on part of patient.

## 2014-06-25 NOTE — Telephone Encounter (Signed)
Initially try just simvastatin 40 mg

## 2014-07-01 ENCOUNTER — Other Ambulatory Visit: Payer: Self-pay | Admitting: Cardiovascular Disease

## 2014-07-02 NOTE — Telephone Encounter (Signed)
Rx(s) sent to pharmacy electronically.  

## 2014-07-23 ENCOUNTER — Telehealth: Payer: Self-pay | Admitting: Cardiovascular Disease

## 2014-07-23 DIAGNOSIS — R7989 Other specified abnormal findings of blood chemistry: Secondary | ICD-10-CM

## 2014-07-23 LAB — TSH: TSH: 2.357 u[IU]/mL (ref 0.350–4.500)

## 2014-07-23 NOTE — Telephone Encounter (Signed)
Returned call to patient TSH order put in.Advised he does not have to fast.

## 2014-07-23 NOTE — Telephone Encounter (Signed)
Pt would like for you to send a lab order downstairs today please. Pt wants to know if he needs to fast? If so,please call asap.

## 2014-08-27 ENCOUNTER — Other Ambulatory Visit: Payer: Self-pay | Admitting: *Deleted

## 2014-08-27 MED ORDER — LEVOTHYROXINE SODIUM 125 MCG PO TABS: 125.0000 ug | ORAL_TABLET | Freq: Every day | ORAL | Status: DC

## 2015-02-04 ENCOUNTER — Telehealth: Payer: Self-pay | Admitting: Cardiovascular Disease

## 2015-02-05 NOTE — Telephone Encounter (Signed)
Close encounter 

## 2015-02-26 ENCOUNTER — Ambulatory Visit (INDEPENDENT_AMBULATORY_CARE_PROVIDER_SITE_OTHER): Payer: BLUE CROSS/BLUE SHIELD | Admitting: Cardiovascular Disease

## 2015-02-26 ENCOUNTER — Encounter: Payer: Self-pay | Admitting: Cardiovascular Disease

## 2015-02-26 VITALS — BP 124/88 | HR 68 | Ht 71.0 in | Wt 233.0 lb

## 2015-02-26 DIAGNOSIS — I10 Essential (primary) hypertension: Secondary | ICD-10-CM

## 2015-02-26 DIAGNOSIS — E039 Hypothyroidism, unspecified: Secondary | ICD-10-CM | POA: Diagnosis not present

## 2015-02-26 DIAGNOSIS — Z9989 Dependence on other enabling machines and devices: Secondary | ICD-10-CM

## 2015-02-26 DIAGNOSIS — E785 Hyperlipidemia, unspecified: Secondary | ICD-10-CM | POA: Diagnosis not present

## 2015-02-26 DIAGNOSIS — G4733 Obstructive sleep apnea (adult) (pediatric): Secondary | ICD-10-CM

## 2015-02-26 DIAGNOSIS — E782 Mixed hyperlipidemia: Secondary | ICD-10-CM

## 2015-02-26 DIAGNOSIS — K219 Gastro-esophageal reflux disease without esophagitis: Secondary | ICD-10-CM

## 2015-02-26 LAB — COMPREHENSIVE METABOLIC PANEL
ALT: 19 U/L (ref 9–46)
AST: 17 U/L (ref 10–35)
Albumin: 3.9 g/dL (ref 3.6–5.1)
Alkaline Phosphatase: 57 U/L (ref 40–115)
BUN: 12 mg/dL (ref 7–25)
CHLORIDE: 105 mmol/L (ref 98–110)
CO2: 27 mmol/L (ref 20–31)
Calcium: 8.9 mg/dL (ref 8.6–10.3)
Creat: 0.94 mg/dL (ref 0.70–1.33)
Glucose, Bld: 100 mg/dL — ABNORMAL HIGH (ref 65–99)
POTASSIUM: 4.4 mmol/L (ref 3.5–5.3)
Sodium: 140 mmol/L (ref 135–146)
TOTAL PROTEIN: 6.5 g/dL (ref 6.1–8.1)
Total Bilirubin: 0.5 mg/dL (ref 0.2–1.2)

## 2015-02-26 LAB — CBC
HCT: 40.6 % (ref 39.0–52.0)
Hemoglobin: 13.8 g/dL (ref 13.0–17.0)
MCH: 29.9 pg (ref 26.0–34.0)
MCHC: 34 g/dL (ref 30.0–36.0)
MCV: 88.1 fL (ref 78.0–100.0)
MPV: 9.5 fL (ref 8.6–12.4)
Platelets: 289 10*3/uL (ref 150–400)
RBC: 4.61 MIL/uL (ref 4.22–5.81)
RDW: 13.5 % (ref 11.5–15.5)
WBC: 4.6 10*3/uL (ref 4.0–10.5)

## 2015-02-26 LAB — LIPID PANEL
CHOL/HDL RATIO: 3.7 ratio (ref <=5.0)
CHOLESTEROL: 153 mg/dL (ref 125–200)
HDL: 41 mg/dL (ref >=40)
LDL CALC: 94 mg/dL (ref <130)
TRIGLYCERIDES: 88 mg/dL (ref <150)
VLDL: 18 mg/dL (ref <30)

## 2015-02-26 LAB — TSH: TSH: 1.188 u[IU]/mL (ref 0.350–4.500)
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
## 2015-02-26 NOTE — Patient Instructions (Signed)
Your physician recommends that you return for lab work fasting.   Your physician wants you to follow-up in: 1 year or sooner if needed. You will receive a reminder letter in the mail two months in advance. If you don't receive a letter, please call our office to schedule the follow-up appointment.  If you need a refill on your cardiac medications before your next appointment, please call your pharmacy.    

## 2015-03-06 ENCOUNTER — Other Ambulatory Visit: Payer: Self-pay

## 2015-03-06 MED ORDER — LEVOTHYROXINE SODIUM 125 MCG PO TABS: 125.0000 ug | ORAL_TABLET | Freq: Every day | ORAL | Status: DC

## 2015-03-06 NOTE — Telephone Encounter (Signed)
Received rx request from [facility] for Levothroxine

## 2015-06-25 ENCOUNTER — Other Ambulatory Visit: Payer: Self-pay | Admitting: Cardiovascular Disease

## 2015-06-25 NOTE — Telephone Encounter (Signed)
Rx refill sent to pharmacy. 

## 2015-08-07 ENCOUNTER — Other Ambulatory Visit: Payer: Self-pay | Admitting: Cardiovascular Disease

## 2015-08-07 NOTE — Telephone Encounter (Signed)
Rx request sent to pharmacy.  

## 2015-08-11 ENCOUNTER — Other Ambulatory Visit: Payer: Self-pay | Admitting: *Deleted

## 2015-08-11 MED ORDER — IRBESARTAN 300 MG PO TABS: 300.0000 mg | ORAL_TABLET | Freq: Every day | ORAL | Status: DC

## 2016-01-01 ENCOUNTER — Emergency Department (HOSPITAL_BASED_OUTPATIENT_CLINIC_OR_DEPARTMENT_OTHER)
Admission: EM | Admit: 2016-01-01 | Discharge: 2016-01-01 | Disposition: A | Discharge status: Home / Self Care | Payer: BLUE CROSS/BLUE SHIELD | Attending: Emergency Medicine | Admitting: Emergency Medicine

## 2016-01-01 ENCOUNTER — Encounter (HOSPITAL_BASED_OUTPATIENT_CLINIC_OR_DEPARTMENT_OTHER): Payer: Self-pay | Admitting: *Deleted

## 2016-01-01 ENCOUNTER — Emergency Department (HOSPITAL_BASED_OUTPATIENT_CLINIC_OR_DEPARTMENT_OTHER): Payer: BLUE CROSS/BLUE SHIELD

## 2016-01-01 ENCOUNTER — Telehealth: Payer: Self-pay | Admitting: Cardiovascular Disease

## 2016-01-01 DIAGNOSIS — I10 Essential (primary) hypertension: Secondary | ICD-10-CM | POA: Insufficient documentation

## 2016-01-01 DIAGNOSIS — F4489 Other dissociative and conversion disorders: Secondary | ICD-10-CM | POA: Diagnosis not present

## 2016-01-01 DIAGNOSIS — Z79899 Other long term (current) drug therapy: Secondary | ICD-10-CM | POA: Diagnosis not present

## 2016-01-01 DIAGNOSIS — R410 Disorientation, unspecified: Secondary | ICD-10-CM | POA: Diagnosis present

## 2016-01-01 DIAGNOSIS — Z7982 Long term (current) use of aspirin: Secondary | ICD-10-CM | POA: Insufficient documentation

## 2016-01-01 LAB — URINALYSIS, ROUTINE W REFLEX MICROSCOPIC
Bilirubin Urine: NEGATIVE
GLUCOSE, UA: NEGATIVE mg/dL
HGB URINE DIPSTICK: NEGATIVE
KETONES UR: NEGATIVE mg/dL
LEUKOCYTES UA: NEGATIVE
Nitrite: NEGATIVE
PROTEIN: NEGATIVE mg/dL
Specific Gravity, Urine: 1.021 (ref 1.005–1.030)
pH: 6.5 (ref 5.0–8.0)

## 2016-01-01 LAB — COMPREHENSIVE METABOLIC PANEL
ALK PHOS: 56 U/L (ref 38–126)
ALT: 21 U/L (ref 17–63)
ANION GAP: 7 (ref 5–15)
AST: 20 U/L (ref 15–41)
Albumin: 4.1 g/dL (ref 3.5–5.0)
BILIRUBIN TOTAL: 0.9 mg/dL (ref 0.3–1.2)
BUN: 12 mg/dL (ref 6–20)
CALCIUM: 8.9 mg/dL (ref 8.9–10.3)
CO2: 27 mmol/L (ref 22–32)
Chloride: 105 mmol/L (ref 101–111)
Creatinine, Ser: 0.93 mg/dL (ref 0.61–1.24)
Glucose, Bld: 95 mg/dL (ref 65–99)
POTASSIUM: 3.9 mmol/L (ref 3.5–5.1)
Sodium: 139 mmol/L (ref 135–145)
TOTAL PROTEIN: 7.1 g/dL (ref 6.5–8.1)

## 2016-01-01 LAB — CBC WITH DIFFERENTIAL/PLATELET
BASOS PCT: 0 %
Basophils Absolute: 0 10*3/uL (ref 0.0–0.1)
Eosinophils Absolute: 0.1 10*3/uL (ref 0.0–0.7)
Eosinophils Relative: 2 %
HEMATOCRIT: 42.4 % (ref 39.0–52.0)
HEMOGLOBIN: 14.3 g/dL (ref 13.0–17.0)
LYMPHS ABS: 0.9 10*3/uL (ref 0.7–4.0)
LYMPHS PCT: 16 %
MCH: 30.6 pg (ref 26.0–34.0)
MCHC: 33.7 g/dL (ref 30.0–36.0)
MCV: 90.8 fL (ref 78.0–100.0)
MONO ABS: 0.6 10*3/uL (ref 0.1–1.0)
MONOS PCT: 11 %
NEUTROS ABS: 4 10*3/uL (ref 1.7–7.7)
Neutrophils Relative %: 71 %
Platelets: 237 10*3/uL (ref 150–400)
RBC: 4.67 MIL/uL (ref 4.22–5.81)
RDW: 12.7 % (ref 11.5–15.5)
WBC: 5.7 10*3/uL (ref 4.0–10.5)

## 2016-01-01 LAB — TROPONIN I

## 2016-01-01 LAB — CBG MONITORING, ED: Glucose-Capillary: 98 mg/dL (ref 65–99)

## 2016-01-01 NOTE — Telephone Encounter (Signed)
Received a call from patient.He stated his B/P is elevated.Stated he just don't feel right.Stated he is unable to think.Cannot focus.Stated he has never felt like this, something is wrong. No headache.No weakness in in extremities. No slurred speech.Advised he needs to go to ER.[facility] to [facility] for review.

## 2016-01-01 NOTE — ED Notes (Signed)
Pt reports feeling normal when he woke up. At 8:15 had sudden onset of "cognitive issues". Pt with difficulty describing how he is feeling. Pt states he was having issues focusing at work. Pt reports it felt strange enough to make him seek treatment even though he normally would never come to the ED.  Pt denies chest pain, SOB. Pt reports symptoms are ongoing.

## 2016-01-01 NOTE — ED Triage Notes (Signed)
When to work and was not able to focus or thinking. Foggy. States his BP was elevated. Hx of HTN. Drove himself here. Alert oriented.

## 2016-01-01 NOTE — ED Provider Notes (Signed)
[facility] Provider Note   CSN: [id number] Arrival date & time: 01/01/16  1041     History   Chief Complaint No chief complaint on file.   HPI Brian Mcgee is a 55 y.o. male.  The history is provided by the patient. No language interpreter was used.  Altered Mental Status   This is a new problem. The current episode started 3 to 5 hours ago. The problem has been gradually worsening. Associated symptoms include confusion. Pertinent negatives include no weakness. His past medical history is significant for heart disease.  Pt reports he has had difficulty concentrating.  Pt reports he feels foggy.  Pt reports when he was at work he had people tell him about things he did not remember.    Past Medical History:  Diagnosis Date  . Family history of heart disease   . Hyperlipidemia   . Hypertension   . Obesity   . OSA (obstructive sleep apnea)    on CPAP    Patient Active Problem List   Diagnosis Date Noted  . Essential hypertension 09/27/2012  . Hyperlipidemia, mixed 09/27/2012  . OSA on CPAP 09/27/2012  . GERD (gastroesophageal reflux disease) 09/27/2012    Past Surgical History:  Procedure Laterality Date  . CARDIOVASCULAR STRESS TEST  11/30/2010   No scintigraphic evidence of inducible myocardial ischemia. EKG negative for ischemia. No ECG changes.  Marland Kitchen SLEEP STUDY INTERPRETATION  01/14/2011   AHI-30.7/hr, AHI during REM-5.7/hr, RDI-30.7/hr, RDI during REM-5.7/hr, average oxygen saturation during REM and NREM-93%, lowest oxygen saturation during NREM-81%, lowest oxygen saturation during REM-85%,   . TRANSTHORACIC ECHOCARDIOGRAM  11/30/2010   EF >55%, normal LV systolic function, normal RV systolic function       Home Medications    Prior to Admission medications   Medication Sig Start Date End Date Taking? Authorizing Provider  aspirin 81 MG tablet Take 81 mg by mouth daily.    Historical Provider, MD  esomeprazole (NEXIUM) 20 MG capsule Take 20  mg by mouth daily at 12 noon.    Historical Provider, MD  irbesartan (AVAPRO) 300 MG tablet Take 1 tablet (300 mg total) by mouth daily. 08/11/15   Lennette Bihari, MD  levothyroxine (SYNTHROID) 125 MCG tablet Take 1 tablet (125 mcg total) by mouth daily before breakfast. 03/06/15   Lennette Bihari, MD  OVER THE COUNTER MEDICATION CPAP therapy    Historical Provider, MD  simvastatin (ZOCOR) 40 MG tablet TAKE 1 BY MOUTH AT BEDTIME 06/25/15   Lennette Bihari, MD    Family History Family History  Problem Relation Age of Onset  . Heart failure Father   . Heart disease Father     CABG  . Hypertension Father   . Diabetes Father     Type 2 diabetes  . Alzheimer's disease Mother   . Hypertension Mother   . Diabetes Mother     Type 2 diabetes  . Hypertension Sister   . Aneurysm Maternal Grandmother   . Hypertension Maternal Grandmother   . Heart disease Maternal Grandfather     CABG  . Cancer Maternal Grandfather     Colon cancer & Leukemia  . Diabetes Maternal Grandfather     Type 2 diabetes  . Hypertension Maternal Grandfather   . Hypertension Paternal Grandmother   . Diabetes Paternal Grandmother     Type 1 diabetes  . Hernia Paternal Grandmother   . Heart attack Paternal Grandmother   . Cancer Paternal Grandfather  Lung cancer    Social History Social History  Substance Use Topics  . Smoking status: Never Smoker  . Smokeless tobacco: Never Used  . Alcohol use Yes     Comment: socially     Allergies   Penicillins   Review of Systems Review of Systems  Neurological: Negative for weakness.  Psychiatric/Behavioral: Positive for confusion and decreased concentration.  All other systems reviewed and are negative.    Physical Exam Updated Vital Signs BP 153/94   Pulse 88   Temp 97.9 F (36.6 C) (Oral)   Resp 25   Ht 5\' 11"  (1.803 m)   Wt 105.7 kg   SpO2 98%   BMI 32.50 kg/m   Physical Exam  Constitutional: He appears well-developed and well-nourished.    HENT:  Head: Normocephalic and atraumatic.  Right Ear: External ear normal.  Left Ear: External ear normal.  Mouth/Throat: Oropharynx is clear and moist.  Eyes: Conjunctivae and EOM are normal. Pupils are equal, round, and reactive to light.  Neck: Normal range of motion.  Cardiovascular: Normal rate and regular rhythm.   Pulmonary/Chest: Effort normal.  Abdominal: Soft.  Musculoskeletal: Normal range of motion.  Neurological: He is alert.  Skin: Skin is warm.  Psychiatric: He has a normal mood and affect.  Nursing note and vitals reviewed.    ED Treatments / Results  Labs (all labs ordered are listed, but only abnormal results are displayed) Labs Reviewed  CBC WITH DIFFERENTIAL/PLATELET  COMPREHENSIVE METABOLIC PANEL  URINALYSIS, ROUTINE W REFLEX MICROSCOPIC (NOT [facility])  TROPONIN I  CBG MONITORING, ED    EKG  EKG Interpretation  Date/Time:  Friday January 01 2016 11:00:42 EST Ventricular Rate:  80 PR Interval:    QRS Duration: 102 QT Interval:  387 QTC Calculation: 447 R Axis:   3 Text Interpretation:  Sinus rhythm RSR' in V1 or V2, right VCD or RVH No previous tracing Confirmed by Anitra LauthPLUNKETT  MD, WHITNEY ([id number]) on 01/01/2016 12:13:22 PM       Radiology Ct Head Wo Contrast  Result Date: 01/01/2016 CLINICAL DATA:  Awoke confused.  No known injury. EXAM: CT HEAD WITHOUT CONTRAST TECHNIQUE: Contiguous axial images were obtained from the base of the skull through the vertex without intravenous contrast. COMPARISON:  None. FINDINGS: Brain: No acute or remote infarction, hemorrhage, hydrocephalus, extra-axial collection or mass lesion/mass effect. Vascular: No hyperdense vessel or unexpected calcification. Skull: Normal. Negative for fracture or focal lesion. Sinuses/Orbits: No acute finding. IMPRESSION: Normal head CT. Electronically Signed   By: Marnee SpringJonathon  Watts M.D.   On: 01/01/2016 13:09    Procedures Procedures (including critical care time)  Medications Ordered  in ED Medications - No data to display   Initial Impression / Assessment and Plan / ED Course  I have reviewed the triage vital signs and the nursing notes.  Pertinent labs & imaging results that were available during my care of the patient were reviewed by me and considered in my medical decision making (see chart for details).  Clinical Course     I spoke with Dr. Roxy Mannsster who advised pattern does not sound like cva.   He advised follow up.  I counseled pt.  He feels normal now.   He is advised to take asa.   He is to see Dr. Tresa EndoKelly on Monday or Tuesday for recheck.   He is to return if symptoms worsen or change.  Final Clinical Impressions(s) / ED Diagnoses   Final diagnoses:  Mental confusion  New Prescriptions New Prescriptions   No medications on file  An After Visit Summary was printed and given to the patient.   Lonia SkinnerLeslie K SentinelSofia, PA-C 01/01/16 1556    Gwyneth SproutWhitney Plunkett, MD 01/02/16 [phone]

## 2016-01-01 NOTE — ED Notes (Signed)
Patient transported to CT 

## 2016-01-01 NOTE — Telephone Encounter (Signed)
New message   169/98, 160/99  Memory issues/very hard time focusing  Feels foggy

## 2016-01-01 NOTE — ED Notes (Signed)
Dr. Anitra LauthPlunkett made aware of pt presentation.

## 2016-01-07 ENCOUNTER — Encounter: Payer: Self-pay | Admitting: Cardiovascular Disease

## 2016-01-07 ENCOUNTER — Ambulatory Visit (INDEPENDENT_AMBULATORY_CARE_PROVIDER_SITE_OTHER): Payer: BLUE CROSS/BLUE SHIELD | Admitting: Cardiovascular Disease

## 2016-01-07 VITALS — BP 162/101 | HR 73 | Ht 71.0 in | Wt 236.4 lb

## 2016-01-07 DIAGNOSIS — R946 Abnormal results of thyroid function studies: Secondary | ICD-10-CM

## 2016-01-07 DIAGNOSIS — Z79899 Other long term (current) drug therapy: Secondary | ICD-10-CM | POA: Diagnosis not present

## 2016-01-07 DIAGNOSIS — R7989 Other specified abnormal findings of blood chemistry: Secondary | ICD-10-CM

## 2016-01-07 DIAGNOSIS — E8881 Metabolic syndrome: Secondary | ICD-10-CM

## 2016-01-07 DIAGNOSIS — E782 Mixed hyperlipidemia: Secondary | ICD-10-CM | POA: Diagnosis not present

## 2016-01-07 DIAGNOSIS — R42 Dizziness and giddiness: Secondary | ICD-10-CM | POA: Diagnosis not present

## 2016-01-07 DIAGNOSIS — I10 Essential (primary) hypertension: Secondary | ICD-10-CM

## 2016-01-07 DIAGNOSIS — G4733 Obstructive sleep apnea (adult) (pediatric): Secondary | ICD-10-CM

## 2016-01-07 DIAGNOSIS — E669 Obesity, unspecified: Secondary | ICD-10-CM

## 2016-01-07 MED ORDER — SIMVASTATIN 40 MG PO TABS: 20.0000 mg | ORAL_TABLET | Freq: Every day | ORAL | 2 refills | Status: DC

## 2016-01-07 MED ORDER — AMLODIPINE BESYLATE 5 MG PO TABS: 5.0000 mg | ORAL_TABLET | Freq: Every day | ORAL | 3 refills | Status: DC

## 2016-01-07 NOTE — Patient Instructions (Signed)
Your physician has requested that you have a carotid duplex. This test is an ultrasound of the carotid arteries in your neck. It looks at blood flow through these arteries that supply the brain with blood. Allow one hour for this exam. There are no restrictions or special instructions.  Your physician recommends that you return for lab work in: FASTING.  Your physician has recommended you make the following change in your medication:   1.) start new amlodipine prescription.  2.) cut the simvastatin into 1/2. Take 20 mg daily.  Your physician recommends that you schedule a follow-up appointment in: 2-3 months.

## 2016-01-09 NOTE — Progress Notes (Signed)
Patient ID: Brian Mcgee, male   DOB: [birth year], 55 y.o.   MRN: [id number]     HPI: Brian Mcgee is a 55 y.o. male who presents for an 28 month cardiology evaluation.   Brian Mcgee has a history of hypertension, mixed hyperlipidemia, strong family history for CAD, obstructive sleep apnea on CPAP therapy, as well as obesity. He was diagnosed with hypothyroidism  and has been on  Synthroid replacement.  He developed an ACE-induced cough and lisinopril and changed him to irbesartan. An NMR profile in 2014 yielded LDL particle number of 1055, calculated LDL cholesterol 77, HDL cholesterol 47, triglycerides 105, and total cholesterol 145 on Simcor 1000/20 regimen. He did have increased insulin resistance. Had normal chemistry profile. TSH was borderline increased at 5.136.   He is an Agricultural consultant for [employer].  And I saw him last yearhe had been away from home for some duration dealing with issues resulting from [place].  He was not consistently using CPAP.  He had gained weight and was not exercising as well as he had in the past.  Over the past year, he states that he has not been using his CPAP.  He is on his way to [place] today since his daughter who lives in [place] was involved in a car accident yesterday and totaled her car.  On January 01, 2016 was evaluated after he had experienced an episode where  he could not concentrate and felt "foggy. "   A CT scan of his head was normal.He was. He denies chest pain. His blood pressure was elevated at 153/94.  His symptoms had lasted for several hours and then resolved and have not recurred. He presents today for follow-up evaluation. He denies palpitations. He denies PND, orthopnea.  He denies presyncope or syncope.  He presents for reevaluation.  Past Medical History:  Diagnosis Date  . Family history of heart disease   . Hyperlipidemia   . Hypertension   . Obesity   . OSA (obstructive sleep apnea)    on CPAP     Past Surgical History:  Procedure Laterality Date  . CARDIOVASCULAR STRESS TEST  11/30/2010   No scintigraphic evidence of inducible myocardial ischemia. EKG negative for ischemia. No ECG changes.  Marland Kitchen SLEEP STUDY INTERPRETATION  01/14/2011   AHI-30.7/hr, AHI during REM-5.7/hr, RDI-30.7/hr, RDI during REM-5.7/hr, average oxygen saturation during REM and NREM-93%, lowest oxygen saturation during NREM-81%, lowest oxygen saturation during REM-85%,   . TRANSTHORACIC ECHOCARDIOGRAM  11/30/2010   EF >55%, normal LV systolic function, normal RV systolic function    Allergies  Allergen Reactions  . Penicillins Rash    Current Outpatient Prescriptions  Medication Sig Dispense Refill  . aspirin 81 MG tablet Take 81 mg by mouth daily.    Marland Kitchen esomeprazole (NEXIUM) 20 MG capsule Take 20 mg by mouth daily at 12 noon.    . irbesartan (AVAPRO) 300 MG tablet Take 1 tablet (300 mg total) by mouth daily. 90 tablet 1  . levothyroxine (SYNTHROID) 125 MCG tablet Take 1 tablet (125 mcg total) by mouth daily before breakfast. 90 tablet 2  . OVER THE COUNTER MEDICATION CPAP therapy    . simvastatin (ZOCOR) 40 MG tablet Take 0.5 tablets (20 mg total) by mouth daily. 90 tablet 2  . amLODipine (NORVASC) 5 MG tablet Take 1 tablet (5 mg total) by mouth daily. 90 tablet 3   No current facility-administered medications for this visit.     Social History  Social History  . Marital status: Married    Spouse name: N/A  . Number of children: N/A  . Years of education: N/A   Occupational History  . Not on file.   Social History Main Topics  . Smoking status: Never Smoker  . Smokeless tobacco: Never Used  . Alcohol use Yes     Comment: socially  . Drug use: Unknown  . Sexual activity: Not on file   Other Topics Concern  . Not on file   Social History Narrative  . No narrative on file    Family History  Problem Relation Age of Onset  . Heart failure Father   . Heart disease Father     CABG  .  Hypertension Father   . Diabetes Father     Type 2 diabetes  . Alzheimer's disease Mother   . Hypertension Mother   . Diabetes Mother     Type 2 diabetes  . Hypertension Sister   . Aneurysm Maternal Grandmother   . Hypertension Maternal Grandmother   . Heart disease Maternal Grandfather     CABG  . Cancer Maternal Grandfather     Colon cancer & Leukemia  . Diabetes Maternal Grandfather     Type 2 diabetes  . Hypertension Maternal Grandfather   . Hypertension Paternal Grandmother   . Diabetes Paternal Grandmother     Type 1 diabetes  . Hernia Paternal Grandmother   . Heart attack Paternal Grandmother   . Cancer Paternal Grandfather     Lung cancer   Social history is notable in that he is married has 2 children. Is no tobacco or alcohol use.  ROS General: Positive for weight fluctuation; No fevers, chills, or night sweats;  HEENT: Negative; No changes in vision or hearing, sinus congestion, difficulty swallowing Pulmonary: Negative; No cough, wheezing, shortness of breath, hemoptysis Cardiovascular: Negative; No chest pain, presyncope, syncope, palpitations GI: Negative; No nausea, vomiting, diarrhea, or abdominal pain GU: Negative; No dysuria, hematuria, or difficulty voiding Musculoskeletal: Negative; no myalgias, joint pain, or weakness Hematologic/Oncology: Negative; no easy bruising, bleeding Endocrine: Positive for hypothyroidism Neuro: Negative; no changes in balance, headaches Skin: Negative; No rashes or skin lesions Psychiatric: Negative; No behavioral problems, depression Sleep: Positive for obstructive sleep apnea on CPAP No snoring, daytime sleepiness, hypersomnolence, bruxism, restless legs, hypnogognic hallucinations, no cataplexy Other comprehensive 14 point system review is negative.  PE BP (!) 162/101   Pulse 73   Ht _0  (1.803 m)   Wt 236 lb 6.4 oz (107.2 kg)   BMI 32.97 kg/m    Repeat blood pressure by me was 154/95  Wt Readings from Last 3  Encounters:  01/07/16 236 lb 6.4 oz (107.2 kg)  01/01/16 233 lb (105.7 kg)  02/26/15 233 lb (105.7 kg)   General: Alert, oriented, no distress.  Skin: normal turgor, no rashes HEENT: Normocephalic, atraumatic. Pupils round and reactive; sclera anicteric;no lid lag.  Nose without nasal septal hypertrophy Mouth/Parynx benign; Mallinpatti scale 3 Neck: No JVD, no carotid  with normal carotid upstroke  Lungs: clear to ausculatation and percussion; no wheezing or rales Heart: RRR, s1 s2 normal 1/6 systolic murmur.No diastolic murmur. No S3 or S4 gallop. No rubs thrills or heaves  Abdomen: Mild central adiposity soft, nontender; no hepatosplenomehaly, BS+; abdominal aorta nontender and not dilated by palpation. Back: No CVA tenderness  Pulses 2+ Extremities: no clubbing cyanosis or edema, Homan's sign negative  Neurologic: grossly nonfocal Psychological: Normal affect and mood  ECG (independently read  by me): normal sinus rhythm at 73 bpm.  Normal intervals; no significant STT  Changes.  I also personally reviewed the ECG from the emergency room taken on January 01, 2016 which showed normal sinus rhythm at 80 bpm without significant abnormality  January 2017ECG (independently read by me):   Normal sinus rhythm at 68 bpm.  Mild sinus arrhythmia. T-wave inversion in lead 3.  No ectopy.  ECG(independently read by me): Normal sinus rhythm with mild sinus arrhythmia.  Heart rate 69 bpm.  Normal intervals.  February 2015 ECG(independently read by me): Normal sinus rhythm 81 beats per minute. Normal intervals  Prior ECG from 09/27/2012: Normal sinus rhythm at 79 beats per minute. Mild RV conduction delay; normal intervals.  LABS:  BMP Latest Ref Rng & Units 01/01/2016 02/26/2015 03/25/2014  Glucose 65 - 99 mg/dL 95 100(H) 110(H)  BUN 6 - 20 mg/dL _0 Creatinine 0.61 - 1.24 mg/dL 0.93 0.94 0.83  Sodium 135 - 145 mmol/L 139 140 140  Potassium 3.5 - 5.1 mmol/L 3.9 4.4 4.2  Chloride 101 -  111 mmol/L 105 105 107  CO2 22 - 32 mmol/L _1 Calcium 8.9 - 10.3 mg/dL 8.9 8.9 9.0    Hepatic Function Latest Ref Rng & Units 01/01/2016 02/26/2015 03/25/2014  Total Protein 6.5 - 8.1 g/dL 7.1 6.5 6.7  Albumin 3.5 - 5.0 g/dL 4.1 3.9 4.1  AST 15 - 41 U/L _2 ALT 17 - 63 U/L _3 Alk Phosphatase 38 - 126 U/L 56 57 53  Total Bilirubin 0.3 - 1.2 mg/dL 0.9 0.5 0.6    CBC Latest Ref Rng & Units 01/01/2016 02/26/2015 03/25/2014  WBC 4.0 - 10.5 K/uL 5.7 4.6 4.8  Hemoglobin 13.0 - 17.0 g/dL 14.3 13.8 14.9  Hematocrit 39.0 - 52.0 % 42.4 40.6 43.6  Platelets 150 - 400 K/uL 237 289 250   Lab Results  Component Value Date   MCV 90.8 01/01/2016   MCV 88.1 02/26/2015   MCV 90.3 03/25/2014   Lab Results  Component Value Date   TSH 1.188 02/26/2015   No results found for: HGBA1C   BNP No results found for: PROBNP   Lipid Panel     Component Value Date/Time   CHOL 153 02/26/2015 1100   CHOL 145 09/27/2012 1137   TRIG 88 02/26/2015 1100   TRIG 105 09/27/2012 1137   HDL 41 02/26/2015 1100   HDL 47 09/27/2012 1137   CHOLHDL 3.7 02/26/2015 1100   VLDL 18 02/26/2015 1100   LDLCALC 94 02/26/2015 1100   LDLCALC 77 09/27/2012 1137     RADIOLOGY: No results found.    ASSESSMENT AND PLAN: Brian Mcgee is a 55 year old gentleman who has a history of hypertension, mixed hyperlipidemia, hypothyroidism, family history for CAD as well as obstructive sleep apnea. He has documented insulin resistance and metabolic syndrome.  Previously he had lost a significant amount weight, but has regained weight and now has started to lose weight again. His blood pressure today is elevated and also was increased at his emergency room evaluation despite taking irbesartan 300 mg daily  I discussed with him the fact that he has obstructive sleep apnea and has not been using CPAP therapy at this undoubtedly may be contributing to his blood pressure elevation and refractory hypertension.  I am  adding amlodipine 5 mg to his medical regimen.  I reviewed his emergency room evaluation.  He had a normal head CT.  I will also obtain carotid Doppler studies to evaluate rods and vertebral blood flow.  He continues to be on simvastatin 40 mg for hyperlipidemia.  With his molic syndrome, target LDL is less than 70.  A long discussion with him concerning the importance of resuming CPAP therapy.  We discussed its effects on hypertension, potential risk for TIA/stroke, nocturnal arrhythmias, as well as insulin resistance. The MRIs 32.97 and is consistent with obesity.  We discussed the importance of weight loss and resumption of actity.  Since I'm adding amlodipine, for now I will reduce his simvastatin to 20 mg but this may need to be changed to a more gh potency statin treatment with Crestor or Lipitor depending upon laboratory results. A complete set of blood work will be obtained. He continues to be on levothyroxine for hypothyroidism and his GERD is controlled with Nexium. I will see him in 2-3 months for reevaluation, and prior to that office visit, we will obtain a CPAP download to assess optimal treatment of his obstructive sleep apnea.    Time spent: 25 minutes  Troy Sine, MD, [facility]  01/09/2016 6:46 AM

## 2016-01-22 ENCOUNTER — Other Ambulatory Visit: Payer: Self-pay

## 2016-01-22 MED ORDER — LEVOTHYROXINE SODIUM 125 MCG PO TABS: 125.0000 ug | ORAL_TABLET | Freq: Every day | ORAL | 2 refills | Status: DC

## 2016-02-02 NOTE — Telephone Encounter (Signed)
Pt was seen in office f/u

## 2016-02-04 ENCOUNTER — Telehealth: Payer: Self-pay | Admitting: Cardiovascular Disease

## 2016-02-04 ENCOUNTER — Ambulatory Visit (HOSPITAL_COMMUNITY)
Admission: RE | Admit: 2016-02-04 | Discharge: 2016-02-04 | Disposition: A | Discharge status: Home / Self Care | Payer: BLUE CROSS/BLUE SHIELD | Source: Ambulatory Visit | Attending: Cardiology | Admitting: Cardiology

## 2016-02-04 DIAGNOSIS — R42 Dizziness and giddiness: Secondary | ICD-10-CM

## 2016-02-04 DIAGNOSIS — I6523 Occlusion and stenosis of bilateral carotid arteries: Secondary | ICD-10-CM | POA: Insufficient documentation

## 2016-02-04 LAB — COMPREHENSIVE METABOLIC PANEL
ALK PHOS: 57 U/L (ref 40–115)
ALT: 21 U/L (ref 9–46)
AST: 17 U/L (ref 10–35)
Albumin: 4.2 g/dL (ref 3.6–5.1)
BILIRUBIN TOTAL: 0.4 mg/dL (ref 0.2–1.2)
BUN: 17 mg/dL (ref 7–25)
CO2: 27 mmol/L (ref 20–31)
Calcium: 9.4 mg/dL (ref 8.6–10.3)
Chloride: 105 mmol/L (ref 98–110)
Creat: 1.15 mg/dL (ref 0.70–1.33)
GLUCOSE: 99 mg/dL (ref 65–99)
Potassium: 4.7 mmol/L (ref 3.5–5.3)
Sodium: 140 mmol/L (ref 135–146)
TOTAL PROTEIN: 6.9 g/dL (ref 6.1–8.1)

## 2016-02-04 LAB — HEMOGLOBIN A1C
HEMOGLOBIN A1C: 5.2 % (ref <5.7)
MEAN PLASMA GLUCOSE: 103 mg/dL

## 2016-02-04 LAB — TSH: TSH: 4.56 m[IU]/L — AB (ref 0.40–4.50)

## 2016-02-04 LAB — LIPID PANEL
CHOLESTEROL: 166 mg/dL (ref <200)
HDL: 50 mg/dL (ref >40)
LDL Cholesterol: 88 mg/dL (ref <100)
Total CHOL/HDL Ratio: 3.3 Ratio (ref <5.0)
Triglycerides: 138 mg/dL (ref <150)
VLDL: 28 mg/dL (ref <30)

## 2016-02-04 NOTE — Telephone Encounter (Signed)
Will you please send his lab order down to [facility] right away.He is on his way and he forgot his lab order. He has an appt up here,for a doppler right after lab work.Pt will be rushing to get up here on time.

## 2016-02-04 NOTE — Telephone Encounter (Signed)
Orders in system and should be viewable by [facility].

## 2016-03-18 ENCOUNTER — Encounter: Payer: Self-pay | Admitting: Cardiovascular Disease

## 2016-03-18 ENCOUNTER — Ambulatory Visit (INDEPENDENT_AMBULATORY_CARE_PROVIDER_SITE_OTHER): Payer: BLUE CROSS/BLUE SHIELD | Admitting: Cardiovascular Disease

## 2016-03-18 VITALS — BP 112/68 | HR 69 | Ht 70.0 in | Wt 232.6 lb

## 2016-03-18 DIAGNOSIS — Z9989 Dependence on other enabling machines and devices: Secondary | ICD-10-CM

## 2016-03-18 DIAGNOSIS — E669 Obesity, unspecified: Secondary | ICD-10-CM

## 2016-03-18 DIAGNOSIS — G4733 Obstructive sleep apnea (adult) (pediatric): Secondary | ICD-10-CM

## 2016-03-18 DIAGNOSIS — E782 Mixed hyperlipidemia: Secondary | ICD-10-CM | POA: Diagnosis not present

## 2016-03-18 DIAGNOSIS — I10 Essential (primary) hypertension: Secondary | ICD-10-CM

## 2016-03-18 DIAGNOSIS — E8881 Metabolic syndrome: Secondary | ICD-10-CM

## 2016-03-18 NOTE — Progress Notes (Signed)
Patient ID: Brian Mcgee, male   DOB: 05-03-1960, 56 y.o.   MRN: [id number]     HPI: Brian Mcgee is a 56 y.o. male who presents for a 2 month cardiology evaluation.   [name] has a history of hypertension, mixed hyperlipidemia, strong family history for CAD, obstructive sleep apnea on CPAP therapy, as well as obesity. He was diagnosed with hypothyroidism  and has been on  Synthroid replacement.  He developed an ACE-induced cough and lisinopril and changed him to irbesartan. An NMR profile in 2014 yielded LDL particle number of 1055, calculated LDL cholesterol 77, HDL cholesterol 47, triglycerides 105, and total cholesterol 145 on Simcor 1000/20 regimen. He did have increased insulin resistance. Had normal chemistry profile. TSH was borderline increased at 5.136.   He is an Agricultural consultant for [employer].  And I saw him last yearhe had been away from home for some duration dealing with issues resulting from [place].  He was not consistently using CPAP.  He had gained weight and was not exercising as well as he had in the past.  Over the past year, he states that he has not been using his CPAP.  He is on his way to [place] today since his daughter who lives in [place] was involved in a car accident yesterday and totaled her car.  On January 01, 2016 was evaluated after he had experienced an episode where  he could not concentrate and felt "foggy. "   A CT scan of his head was normal. He denied chest pain. His blood pressure was elevated at 153/94.  His symptoms had lasted for several hours and then resolved and have not recurred. I saw him for follow-up evaluation in December 2017. At  that time I aded amlodipine 5 mg to his regimen and reduced simvastatin to 20 mg.  I stressed the impotrtance of using CPAP since he had been noncompliant and reviewed the data regarding untreated OSA on cardiovascular risk.    Over the past 2 months he admits to 100% compliance with  CPAP.  He has started to lose weight and has lost 9 lbs. Carotid studies showed very mild heterogenous plaque without significant obstruction.  He feels better and presents for f/u evaluation  Past Medical History:  Diagnosis Date  . Family history of heart disease   . Hyperlipidemia   . Hypertension   . Obesity   . OSA (obstructive sleep apnea)    on CPAP    Past Surgical History:  Procedure Laterality Date  . CARDIOVASCULAR STRESS TEST  11/30/2010   No scintigraphic evidence of inducible myocardial ischemia. EKG negative for ischemia. No ECG changes.  Marland Kitchen SLEEP STUDY INTERPRETATION  01/14/2011   AHI-30.7/hr, AHI during REM-5.7/hr, RDI-30.7/hr, RDI during REM-5.7/hr, average oxygen saturation during REM and NREM-93%, lowest oxygen saturation during NREM-81%, lowest oxygen saturation during REM-85%,   . TRANSTHORACIC ECHOCARDIOGRAM  11/30/2010   EF >55%, normal LV systolic function, normal RV systolic function    Allergies  Allergen Reactions  . Penicillins Rash    Current Outpatient Prescriptions  Medication Sig Dispense Refill  . amLODipine (NORVASC) 5 MG tablet Take 1 tablet (5 mg total) by mouth daily. 90 tablet 3  . aspirin 81 MG tablet Take 81 mg by mouth daily.    Marland Kitchen esomeprazole (NEXIUM) 20 MG capsule Take 20 mg by mouth daily at 12 noon.    . irbesartan (AVAPRO) 300 MG tablet Take 1 tablet (300 mg total) by mouth  daily. 90 tablet 1  . levothyroxine (SYNTHROID) 125 MCG tablet Take 1 tablet (125 mcg total) by mouth daily before breakfast. 90 tablet 2  . OVER THE COUNTER MEDICATION CPAP therapy    . simvastatin (ZOCOR) 40 MG tablet Take 0.5 tablets (20 mg total) by mouth daily. 90 tablet 2   No current facility-administered medications for this visit.     Social History   Social History  . Marital status: Married    Spouse name: N/A  . Number of children: N/A  . Years of education: N/A   Occupational History  . Not on file.   Social History Main Topics  .  Smoking status: Never Smoker  . Smokeless tobacco: Never Used  . Alcohol use Yes     Comment: socially  . Drug use: Unknown  . Sexual activity: Not on file   Other Topics Concern  . Not on file   Social History Narrative  . No narrative on file    Family History  Problem Relation Age of Onset  . Heart failure Father   . Heart disease Father     CABG  . Hypertension Father   . Diabetes Father     Type 2 diabetes  . Alzheimer's disease Mother   . Hypertension Mother   . Diabetes Mother     Type 2 diabetes  . Hypertension Sister   . Aneurysm Maternal Grandmother   . Hypertension Maternal Grandmother   . Heart disease Maternal Grandfather     CABG  . Cancer Maternal Grandfather     Colon cancer & Leukemia  . Diabetes Maternal Grandfather     Type 2 diabetes  . Hypertension Maternal Grandfather   . Hypertension Paternal Grandmother   . Diabetes Paternal Grandmother     Type 1 diabetes  . Hernia Paternal Grandmother   . Heart attack Paternal Grandmother   . Cancer Paternal Grandfather     Lung cancer   Social history is notable in that he is married has 2 children. Is no tobacco or alcohol use.  ROS General: Positive for weight fluctuation; No fevers, chills, or night sweats;  HEENT: Negative; No changes in vision or hearing, sinus congestion, difficulty swallowing Pulmonary: Negative; No cough, wheezing, shortness of breath, hemoptysis Cardiovascular: Negative; No chest pain, presyncope, syncope, palpitations GI: Negative; No nausea, vomiting, diarrhea, or abdominal pain GU: Negative; No dysuria, hematuria, or difficulty voiding Musculoskeletal: Negative; no myalgias, joint pain, or weakness Hematologic/Oncology: Negative; no easy bruising, bleeding Endocrine: Positive for hypothyroidism Neuro: Negative; no changes in balance, headaches Skin: Negative; No rashes or skin lesions Psychiatric: Negative; No behavioral problems, depression Sleep: Positive for  obstructive sleep apnea on CPAP No snoring, daytime sleepiness, hypersomnolence, bruxism, restless legs, hypnogognic hallucinations, no cataplexy Other comprehensive 14 point system review is negative.  PE BP 112/68 (BP Location: Right Arm)   Pulse 69   Ht _0  (1.778 m)   Wt 232 lb 9.6 oz (105.5 kg)   BMI 33.37 kg/m   Repeat blood pressure by me was 110/70 supine; 112/74 standing  Wt Readings from Last 3 Encounters:  03/18/16 232 lb 9.6 oz (105.5 kg)  01/07/16 236 lb 6.4 oz (107.2 kg)  01/01/16 233 lb (105.7 kg)   General: Alert, oriented, no distress.  Skin: normal turgor, no rashes HEENT: Normocephalic, atraumatic. Pupils round and reactive; sclera anicteric;no lid lag.  Nose without nasal septal hypertrophy Mouth/Parynx benign; Mallinpatti scale 3 Neck: No JVD, no carotid  with normal carotid upstroke  Lungs: clear to ausculatation and percussion; no wheezing or rales Heart: RRR, s1 s2 normal 1/6 systolic murmur.No diastolic murmur. No S3 or S4 gallop. No rubs thrills or heaves  Abdomen: Mild central adiposity soft, nontender; no hepatosplenomehaly, BS+; abdominal aorta nontender and not dilated by palpation. Back: No CVA tenderness  Pulses 2+ Extremities: no clubbing cyanosis or edema, Homan's sign negative  Neurologic: grossly nonfocal Psychological: Normal affect and mood  ECG (independently read by me): Normal sinus rhythm with mild sinus arrhythmia.  Normal intervals.  No ST segment changes.  Early transition.  December 2017 ECG (independently read by me): normal sinus rhythm at 73 bpm.  Normal intervals; no significant STT  Changes.  I  personally reviewed the ECG from the emergency room taken on January 01, 2016 which showed normal sinus rhythm at 80 bpm without significant abnormality  January 2017ECG (independently read by me):   Normal sinus rhythm at 68 bpm.  Mild sinus arrhythmia. T-wave inversion in lead 3.  No ectopy.  ECG(independently read by me): Normal  sinus rhythm with mild sinus arrhythmia.  Heart rate 69 bpm.  Normal intervals.  February 2015 ECG(independently read by me): Normal sinus rhythm 81 beats per minute. Normal intervals  Prior ECG from 09/27/2012: Normal sinus rhythm at 79 beats per minute. Mild RV conduction delay; normal intervals.  LABS:  BMP Latest Ref Rng & Units 02/04/2016 01/01/2016 02/26/2015  Glucose 65 - 99 mg/dL 99 95 100(H)  BUN 7 - 25 mg/dL _0 Creatinine 0.70 - 1.33 mg/dL 1.15 0.93 0.94  Sodium 135 - 146 mmol/L 140 139 140  Potassium 3.5 - 5.3 mmol/L 4.7 3.9 4.4  Chloride 98 - 110 mmol/L 105 105 105  CO2 20 - 31 mmol/L _1 Calcium 8.6 - 10.3 mg/dL 9.4 8.9 8.9    Hepatic Function Latest Ref Rng & Units 02/04/2016 01/01/2016 02/26/2015  Total Protein 6.1 - 8.1 g/dL 6.9 7.1 6.5  Albumin 3.6 - 5.1 g/dL 4.2 4.1 3.9  AST 10 - 35 U/L _2 ALT 9 - 46 U/L _3 Alk Phosphatase 40 - 115 U/L 57 56 57  Total Bilirubin 0.2 - 1.2 mg/dL 0.4 0.9 0.5    CBC Latest Ref Rng & Units 01/01/2016 02/26/2015 03/25/2014  WBC 4.0 - 10.5 K/uL 5.7 4.6 4.8  Hemoglobin 13.0 - 17.0 g/dL 14.3 13.8 14.9  Hematocrit 39.0 - 52.0 % 42.4 40.6 43.6  Platelets 150 - 400 K/uL 237 289 250   Lab Results  Component Value Date   MCV 90.8 01/01/2016   MCV 88.1 02/26/2015   MCV 90.3 03/25/2014   Lab Results  Component Value Date   TSH 4.56 (H) 02/04/2016   Lab Results  Component Value Date   HGBA1C 5.2 02/04/2016     BNP No results found for: PROBNP   Lipid Panel     Component Value Date/Time   CHOL 166 02/04/2016 0929   CHOL 145 09/27/2012 1137   TRIG 138 02/04/2016 0929   TRIG 105 09/27/2012 1137   HDL 50 02/04/2016 0929   HDL 47 09/27/2012 1137   CHOLHDL 3.3 02/04/2016 0929   VLDL 28 02/04/2016 0929   LDLCALC 88 02/04/2016 0929   LDLCALC 77 09/27/2012 1137     RADIOLOGY: No results found.  IMPRESSION:  1. Essential hypertension   2. Metabolic syndrome   3. OSA on CPAP   4. Hyperlipidemia,  mixed   5. Obesity, mild  ASSESSMENT AND PLAN: [name] is a 56 year old gentleman who has a history of hypertension, mixed hyperlipidemia, hypothyroidism, family history for CAD as well as obstructive sleep apnea. He has documented insulin resistance and metabolic syndrome.  Previously he had lost a significant amount weight, but has regained weight and now has started to lose weight again. When I last saw him in f/u of his ER evaluation his  blood pressure was elevated and also was increased at his emergency room evaluation despite taking irbesartan 300 mg daily  I discussed with him  that he has obstructive sleep apnea and had not been using CPAP therapy which can contribute to his blood pressure elevation and refractory hypertension.  I added amlodipine 5 mg to his medical regimen and his BP today is now in the normal range.   He had a normal head CT.  I reviewed his carotid Doppler studies which showed mild 1-39% plaque and normal vertebral blood flow bilaterally. Since amlodipine was added, I reduced simvastatin to 20 mg for hyperlipidemia.  With his metabolic syndrome, target LDL is less than 70.  He has started to lose weight and has changed his diet. I discussed his need to increase exercise to at least 5 x week for 30 minutes.  He is noww using CPAP with 100% compliance.  We have set a target weight < obesity threshold.  I will see him in 6 months for re-evaluation.  If LDL remins > 70, I will change him to crestor or lipitor for high potency statin therapy. Time spent: 25 minutes  Troy Sine, MD, [facility]  03/19/2016 4:44 PM

## 2016-03-18 NOTE — Patient Instructions (Signed)
Your physician wants you to follow-up in: 6 months or sooner if needed. You will receive a reminder letter in the mail two months in advance. If you don't receive a letter, please call our office to schedule the follow-up appointment.   If you need a refill on your cardiac medications before your next appointment, please call your pharmacy. 

## 2016-06-16 ENCOUNTER — Telehealth: Payer: Self-pay | Admitting: Cardiovascular Disease

## 2016-06-16 MED ORDER — SIMVASTATIN 40 MG PO TABS: 20.0000 mg | ORAL_TABLET | Freq: Every day | ORAL | 3 refills | Status: DC

## 2016-06-16 NOTE — Telephone Encounter (Signed)
New message    *STAT* If patient is at the pharmacy, call can be transferred to refill team.   1. Which medications need to be refilled? (please list name of each medication and dose if known) simvastatin (ZOCOR) 40 MG tablet   2. Which pharmacy/location (including street and city if local pharmacy) is medication to be sent to? [facility]  3. Do they need a 30 day or 90 day supply? 90 day supply

## 2016-06-24 ENCOUNTER — Other Ambulatory Visit: Payer: Self-pay | Admitting: Cardiovascular Disease

## 2016-06-24 ENCOUNTER — Other Ambulatory Visit: Payer: Self-pay | Admitting: *Deleted

## 2016-06-24 MED ORDER — SIMVASTATIN 40 MG PO TABS: 40.0000 mg | ORAL_TABLET | Freq: Every day | ORAL | 1 refills | Status: DC

## 2016-06-24 MED ORDER — SIMVASTATIN 40 MG PO TABS: 20.0000 mg | ORAL_TABLET | Freq: Every day | ORAL | 1 refills | Status: DC

## 2016-06-24 NOTE — Telephone Encounter (Signed)
REFILL 

## 2016-06-24 NOTE — Telephone Encounter (Signed)
°  New Prob   *STAT* If patient is at the pharmacy, call can be transferred to refill team.   1. Which medications need to be refilled? (please list name of each medication and dose if known): Simvastatin (ZOCOR) 20 MG tablet  2. Which pharmacy/location (including street and city if local pharmacy) is medication to be sent to? [facility] on [address], [place], [place] [id number]   3. Do they need a 30 day or 90 day supply? 90 Days

## 2016-06-24 NOTE — Telephone Encounter (Signed)
Patient calling states that he would like call back once the prescription is called in. Thanks.

## 2016-06-24 NOTE — Telephone Encounter (Deleted)
°*  STAT* If patient is at the pharmacy, call can be transferred to refill team.   1. Which medications need to be refilled? (please list name of each medication and dose if known) Simvastatin 20 mg   2. Which pharmacy/location (including street and city if local pharmacy) is medication to be sent to? [facility]  [phone]  3. Do they need a 30 day or 90 day supply? 90 day    Patient states that he will out of the medication

## 2016-06-28 ENCOUNTER — Telehealth: Payer: Self-pay | Admitting: Cardiovascular Disease

## 2016-06-28 NOTE — Telephone Encounter (Signed)
New Message   PLEASE CALL PT AS SOON AS YOU FILL MEDICATION  PATIENT IS COMPLETELY OUT OF MEDICATION    *STAT* If patient is at the pharmacy, call can be transferred to refill team.   1. Which medications need to be refilled? (please list name of each medication and dose if known)  simvastatin (ZOCOR) 20 MG tablet Take 1 tablet (20 mg total) by mouth at bedtime.   2. Which pharmacy/location (including street and city if local pharmacy) is medication to be sent to? [facility] [address]   3. Do they need a 30 day or 90 day supply? 90

## 2016-06-29 NOTE — Telephone Encounter (Signed)
Spoke with pt who stated he's been trying to get his medication refill since May 17th but no one has been reaching out to him, simvastatin 40 mg was send into pt pharmacy on May 25th but he is taking 20 mg which was change by Dr Tresa EndoKelly in February, pt is very upset of the missed communication between the office and himself and the fact he have to now cut his 40 mg of simvastatin in half, I went ahead and made the change of his simvastatin in [facility] to his simvastatin medication for a 40 mg won't be send into his pharmacy at next.

## 2016-09-05 ENCOUNTER — Other Ambulatory Visit: Payer: Self-pay | Admitting: Cardiovascular Disease

## 2016-09-05 NOTE — Telephone Encounter (Signed)
REFILL 

## 2016-11-22 ENCOUNTER — Other Ambulatory Visit: Payer: Self-pay | Admitting: Cardiovascular Disease

## 2016-11-22 NOTE — Telephone Encounter (Signed)
REFILL 

## 2017-02-03 ENCOUNTER — Other Ambulatory Visit: Payer: Self-pay | Admitting: Cardiovascular Disease

## 2017-03-13 ENCOUNTER — Other Ambulatory Visit: Payer: Self-pay | Admitting: Cardiovascular Disease

## 2017-03-13 NOTE — Telephone Encounter (Signed)
REFILL 

## 2017-03-31 DIAGNOSIS — L300 Nummular dermatitis: Secondary | ICD-10-CM | POA: Diagnosis not present

## 2017-03-31 DIAGNOSIS — L738 Other specified follicular disorders: Secondary | ICD-10-CM | POA: Diagnosis not present

## 2017-04-24 ENCOUNTER — Ambulatory Visit: Payer: BLUE CROSS/BLUE SHIELD | Admitting: Cardiovascular Disease

## 2017-04-24 ENCOUNTER — Encounter: Payer: Self-pay | Admitting: Cardiovascular Disease

## 2017-04-24 VITALS — BP 128/82 | HR 70 | Ht 70.0 in | Wt 244.4 lb

## 2017-04-24 DIAGNOSIS — Z79899 Other long term (current) drug therapy: Secondary | ICD-10-CM | POA: Diagnosis not present

## 2017-04-24 DIAGNOSIS — E8881 Metabolic syndrome: Secondary | ICD-10-CM

## 2017-04-24 DIAGNOSIS — Z9989 Dependence on other enabling machines and devices: Secondary | ICD-10-CM | POA: Diagnosis not present

## 2017-04-24 DIAGNOSIS — G4733 Obstructive sleep apnea (adult) (pediatric): Secondary | ICD-10-CM | POA: Diagnosis not present

## 2017-04-24 DIAGNOSIS — E782 Mixed hyperlipidemia: Secondary | ICD-10-CM | POA: Diagnosis not present

## 2017-04-24 DIAGNOSIS — I10 Essential (primary) hypertension: Secondary | ICD-10-CM | POA: Diagnosis not present

## 2017-04-24 DIAGNOSIS — E039 Hypothyroidism, unspecified: Secondary | ICD-10-CM | POA: Diagnosis not present

## 2017-04-24 DIAGNOSIS — R7989 Other specified abnormal findings of blood chemistry: Secondary | ICD-10-CM | POA: Diagnosis not present
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
## 2017-04-24 NOTE — Patient Instructions (Signed)
Medication Instructions:  Your physician recommends that you continue on your current medications as directed. Please refer to the Current Medication list given to you today.  Labwork: Today (CMET, CBC, Lipid, TSH)  Follow-Up: Your physician wants you to follow-up in: 12 months with Dr. Kelly.  You will receive a reminder letter in the mail two months in advance. If you don't receive a letter, please call our office to schedule the follow-up appointment.   Any Other Special Instructions Will Be Listed Below (If Applicable).     If you need a refill on your cardiac medications before your next appointment, please call your pharmacy.   

## 2017-04-25 ENCOUNTER — Encounter: Payer: Self-pay | Admitting: Cardiovascular Disease

## 2017-04-25 LAB — COMPREHENSIVE METABOLIC PANEL
ALK PHOS: 74 IU/L (ref 39–117)
ALT: 24 IU/L (ref 0–44)
AST: 31 IU/L (ref 0–40)
Albumin/Globulin Ratio: 1.8 (ref 1.2–2.2)
Albumin: 4.4 g/dL (ref 3.5–5.5)
BUN / CREAT RATIO: 11 (ref 9–20)
BUN: 10 mg/dL (ref 6–24)
Bilirubin Total: 0.4 mg/dL (ref 0.0–1.2)
CHLORIDE: 107 mmol/L — AB (ref 96–106)
CO2: 23 mmol/L (ref 20–29)
Calcium: 9.1 mg/dL (ref 8.7–10.2)
Creatinine, Ser: 0.94 mg/dL (ref 0.76–1.27)
GFR calc Af Amer: 105 mL/min/{1.73_m2} (ref >59)
GFR calc non Af Amer: 91 mL/min/{1.73_m2} (ref >59)
GLUCOSE: 85 mg/dL (ref 65–99)
Globulin, Total: 2.5 g/dL (ref 1.5–4.5)
Potassium: 4.2 mmol/L (ref 3.5–5.2)
Sodium: 144 mmol/L (ref 134–144)
Total Protein: 6.9 g/dL (ref 6.0–8.5)

## 2017-04-25 LAB — LIPID PANEL
Chol/HDL Ratio: 3.8 ratio (ref 0.0–5.0)
Cholesterol, Total: 168 mg/dL (ref 100–199)
HDL: 44 mg/dL (ref >39)
LDL Calculated: 94 mg/dL (ref 0–99)
TRIGLYCERIDES: 150 mg/dL — AB (ref 0–149)
VLDL CHOLESTEROL CAL: 30 mg/dL (ref 5–40)

## 2017-04-25 LAB — CBC
HEMATOCRIT: 42.5 % (ref 37.5–51.0)
Hemoglobin: 14.5 g/dL (ref 13.0–17.7)
MCH: 30.4 pg (ref 26.6–33.0)
MCHC: 34.1 g/dL (ref 31.5–35.7)
MCV: 89 fL (ref 79–97)
Platelets: 274 10*3/uL (ref 150–379)
RBC: 4.77 x10E6/uL (ref 4.14–5.80)
RDW: 13.9 % (ref 12.3–15.4)
WBC: 4.7 10*3/uL (ref 3.4–10.8)

## 2017-04-25 LAB — TSH: TSH: 2.89 u[IU]/mL (ref 0.450–4.500)

## 2017-05-15 ENCOUNTER — Other Ambulatory Visit: Payer: Self-pay | Admitting: Cardiovascular Disease

## 2017-06-07 ENCOUNTER — Other Ambulatory Visit: Payer: Self-pay | Admitting: Cardiovascular Disease

## 2017-06-07 NOTE — Telephone Encounter (Signed)
Rx(s) sent to pharmacy electronically.  

## 2017-06-13 ENCOUNTER — Other Ambulatory Visit: Payer: Self-pay

## 2017-06-13 ENCOUNTER — Telehealth: Payer: Self-pay | Admitting: Cardiovascular Disease

## 2017-06-13 MED ORDER — ROSUVASTATIN CALCIUM 20 MG PO TABS: 20.0000 mg | ORAL_TABLET | Freq: Every day | ORAL | 3 refills | Status: DC

## 2017-06-13 NOTE — Telephone Encounter (Signed)
New message   Patient returning call from 5/7 regarding labs.

## 2017-06-13 NOTE — Telephone Encounter (Signed)
Labs results provided to pt along with Dr. Landry Dyke recommendation. Pt is agreeable. Zocor 20 mg discontinued and Crestor 20 mg e-sent to pharmacy.

## 2017-06-23 ENCOUNTER — Other Ambulatory Visit: Payer: Self-pay | Admitting: *Deleted

## 2017-06-23 DIAGNOSIS — Z79899 Other long term (current) drug therapy: Secondary | ICD-10-CM

## 2017-06-23 DIAGNOSIS — E782 Mixed hyperlipidemia: Secondary | ICD-10-CM

## 2017-06-27 DIAGNOSIS — H6123 Impacted cerumen, bilateral: Secondary | ICD-10-CM | POA: Diagnosis not present

## 2017-06-27 DIAGNOSIS — B085 Enteroviral vesicular pharyngitis: Secondary | ICD-10-CM | POA: Diagnosis not present

## 2017-08-09 DIAGNOSIS — Z23 Encounter for immunization: Secondary | ICD-10-CM | POA: Diagnosis not present

## 2017-09-11 DIAGNOSIS — E782 Mixed hyperlipidemia: Secondary | ICD-10-CM | POA: Diagnosis not present

## 2017-09-11 DIAGNOSIS — Z79899 Other long term (current) drug therapy: Secondary | ICD-10-CM | POA: Diagnosis not present

## 2017-09-11 LAB — COMPREHENSIVE METABOLIC PANEL
A/G RATIO: 1.9 (ref 1.2–2.2)
ALT: 22 IU/L (ref 0–44)
AST: 19 IU/L (ref 0–40)
Albumin: 4.5 g/dL (ref 3.5–5.5)
Alkaline Phosphatase: 70 IU/L (ref 39–117)
BILIRUBIN TOTAL: 0.6 mg/dL (ref 0.0–1.2)
BUN/Creatinine Ratio: 16 (ref 9–20)
BUN: 14 mg/dL (ref 6–24)
CALCIUM: 9.5 mg/dL (ref 8.7–10.2)
CHLORIDE: 105 mmol/L (ref 96–106)
CO2: 21 mmol/L (ref 20–29)
Creatinine, Ser: 0.86 mg/dL (ref 0.76–1.27)
GFR calc non Af Amer: 98 mL/min/{1.73_m2} (ref >59)
GFR, EST AFRICAN AMERICAN: 113 mL/min/{1.73_m2} (ref >59)
GLUCOSE: 98 mg/dL (ref 65–99)
Globulin, Total: 2.4 g/dL (ref 1.5–4.5)
POTASSIUM: 4.2 mmol/L (ref 3.5–5.2)
Sodium: 140 mmol/L (ref 134–144)
TOTAL PROTEIN: 6.9 g/dL (ref 6.0–8.5)

## 2017-09-11 LAB — LIPID PANEL
Chol/HDL Ratio: 3.2 ratio (ref 0.0–5.0)
Cholesterol, Total: 132 mg/dL (ref 100–199)
HDL: 41 mg/dL (ref >39)
LDL Calculated: 55 mg/dL (ref 0–99)
TRIGLYCERIDES: 181 mg/dL — AB (ref 0–149)
VLDL CHOLESTEROL CAL: 36 mg/dL (ref 5–40)

## 2017-09-14 ENCOUNTER — Telehealth: Payer: Self-pay | Admitting: Cardiovascular Disease

## 2017-09-14 NOTE — Telephone Encounter (Signed)
Pt is aware needs to continue taking the Rosuvastatin./cy

## 2017-09-14 NOTE — Telephone Encounter (Signed)
New Message:  Patient not currently taking rosuvastatin (CRESTOR) 20 MG tablet(Expired)  He would like to know if he should resume medication

## 2017-10-20 DIAGNOSIS — K645 Perianal venous thrombosis: Secondary | ICD-10-CM | POA: Diagnosis not present

## 2018-04-30 ENCOUNTER — Ambulatory Visit: Payer: BLUE CROSS/BLUE SHIELD | Admitting: Cardiovascular Disease

## 2018-05-18 ENCOUNTER — Other Ambulatory Visit: Payer: Self-pay | Admitting: Cardiovascular Disease

## 2018-05-18 NOTE — Telephone Encounter (Signed)
Irbesartan, synthroid, rosuvastatin and amlodipine refilled.

## 2018-07-17 ENCOUNTER — Telehealth: Payer: BLUE CROSS/BLUE SHIELD | Admitting: Cardiovascular Disease

## 2018-08-13 DIAGNOSIS — Q181 Preauricular sinus and cyst: Secondary | ICD-10-CM | POA: Diagnosis not present

## 2018-08-13 DIAGNOSIS — R196 Halitosis: Secondary | ICD-10-CM | POA: Diagnosis not present

## 2018-08-13 DIAGNOSIS — H61302 Acquired stenosis of left external ear canal, unspecified: Secondary | ICD-10-CM | POA: Diagnosis not present

## 2018-08-13 DIAGNOSIS — H6123 Impacted cerumen, bilateral: Secondary | ICD-10-CM | POA: Diagnosis not present

## 2018-09-03 DIAGNOSIS — H6123 Impacted cerumen, bilateral: Secondary | ICD-10-CM | POA: Diagnosis not present

## 2018-10-31 ENCOUNTER — Other Ambulatory Visit: Payer: Self-pay

## 2018-10-31 MED ORDER — AMLODIPINE BESYLATE 5 MG PO TABS: 5.0000 mg | ORAL_TABLET | Freq: Every day | ORAL | 0 refills | Status: DC

## 2018-11-29 ENCOUNTER — Other Ambulatory Visit: Payer: Self-pay

## 2018-11-29 MED ORDER — ROSUVASTATIN CALCIUM 20 MG PO TABS: ORAL_TABLET | ORAL | 0 refills | Status: DC

## 2018-11-29 MED ORDER — IRBESARTAN 300 MG PO TABS: ORAL_TABLET | ORAL | 1 refills | Status: DC

## 2018-12-02 ENCOUNTER — Other Ambulatory Visit: Payer: Self-pay | Admitting: Cardiovascular Disease

## 2018-12-13 ENCOUNTER — Telehealth: Payer: Self-pay | Admitting: Cardiovascular Disease

## 2018-12-13 MED ORDER — LEVOTHYROXINE SODIUM 125 MCG PO TABS: 125.0000 ug | ORAL_TABLET | Freq: Every day | ORAL | 0 refills | Status: DC

## 2018-12-13 NOTE — Telephone Encounter (Signed)
Patient would like all pharmacies removed except [facility] on [address] in [place].

## 2018-12-13 NOTE — Telephone Encounter (Signed)
°*  STAT* If patient is at the pharmacy, call can be transferred to refill team.   1. Which medications need to be refilled? (please list name of each medication and dose if known) levothyroxine (SYNTHROID) 125 MCG tablet  2. Which pharmacy/location (including street and city if local pharmacy) is medication to be sent to? [place]  3. Do they need a 30 day or 90 day supply? 90 day

## 2018-12-13 NOTE — Telephone Encounter (Signed)
Updated chart, removed all other pharmacies.

## 2018-12-13 NOTE — Telephone Encounter (Signed)
Rx(s) sent to pharmacy electronically.  

## 2019-01-08 ENCOUNTER — Encounter: Payer: Self-pay | Admitting: Cardiovascular Disease

## 2019-01-08 ENCOUNTER — Other Ambulatory Visit: Payer: Self-pay

## 2019-01-08 ENCOUNTER — Ambulatory Visit: Payer: BC Managed Care – PPO | Admitting: Cardiovascular Disease

## 2019-01-08 VITALS — BP 129/88 | HR 70 | Temp 98.4°F | Ht 70.5 in | Wt 254.6 lb

## 2019-01-08 DIAGNOSIS — E039 Hypothyroidism, unspecified: Secondary | ICD-10-CM

## 2019-01-08 DIAGNOSIS — Z79899 Other long term (current) drug therapy: Secondary | ICD-10-CM | POA: Diagnosis not present

## 2019-01-08 DIAGNOSIS — E782 Mixed hyperlipidemia: Secondary | ICD-10-CM

## 2019-01-08 DIAGNOSIS — I10 Essential (primary) hypertension: Secondary | ICD-10-CM

## 2019-01-08 NOTE — Patient Instructions (Signed)
Medication Instructions:  Your physician recommends that you continue on your current medications as directed. Please refer to the Current Medication list given to you today.  *If you need a refill on your cardiac medications before your next appointment, please call your pharmacy*  Lab Work: LAB work today   If you have labs (blood work) drawn today and your tests are completely normal, you will receive your results only by: Marland Kitchen MyChart Message (if you have MyChart) OR . A paper copy in the mail If you have any lab test that is abnormal or we need to change your treatment, we will call you to review the results.  Testing/Procedures: NONE  Follow-Up: At [facility], you and your health needs are our priority.  As part of our continuing mission to provide you with exceptional heart care, we have created designated Provider Care Teams.  These Care Teams include your primary Cardiologist (physician) and Advanced Practice Providers (APPs -  Physician Assistants and Nurse Practitioners) who all work together to provide you with the care you need, when you need it.  Your next appointment:   12 month(s)  The format for your next appointment:   In Person  Provider:   You may see Shelva Majestic, MD or one of the following Advanced Practice Providers on your designated Care Team:    Almyra Deforest, PA-C  Fabian Sharp, PA-C or   Roby Lofts, [place]   Other Instructions
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
## 2019-01-09 ENCOUNTER — Encounter: Payer: Self-pay | Admitting: Cardiovascular Disease

## 2019-01-09 LAB — COMPREHENSIVE METABOLIC PANEL
ALT: 28 IU/L (ref 0–44)
AST: 26 IU/L (ref 0–40)
Albumin/Globulin Ratio: 1.7 (ref 1.2–2.2)
Albumin: 4.5 g/dL (ref 3.8–4.9)
Alkaline Phosphatase: 71 IU/L (ref 39–117)
BUN/Creatinine Ratio: 13 (ref 9–20)
BUN: 14 mg/dL (ref 6–24)
Bilirubin Total: 0.5 mg/dL (ref 0.0–1.2)
CO2: 23 mmol/L (ref 20–29)
Calcium: 9.3 mg/dL (ref 8.7–10.2)
Chloride: 105 mmol/L (ref 96–106)
Creatinine, Ser: 1.05 mg/dL (ref 0.76–1.27)
GFR calc Af Amer: 91 mL/min/{1.73_m2} (ref >59)
GFR calc non Af Amer: 79 mL/min/{1.73_m2} (ref >59)
Globulin, Total: 2.7 g/dL (ref 1.5–4.5)
Glucose: 101 mg/dL — ABNORMAL HIGH (ref 65–99)
Potassium: 4.6 mmol/L (ref 3.5–5.2)
Sodium: 141 mmol/L (ref 134–144)
Total Protein: 7.2 g/dL (ref 6.0–8.5)

## 2019-01-09 LAB — CBC
Hematocrit: 43.8 % (ref 37.5–51.0)
Hemoglobin: 15.3 g/dL (ref 13.0–17.7)
MCH: 30.7 pg (ref 26.6–33.0)
MCHC: 34.9 g/dL (ref 31.5–35.7)
MCV: 88 fL (ref 79–97)
Platelets: 292 10*3/uL (ref 150–450)
RBC: 4.98 x10E6/uL (ref 4.14–5.80)
RDW: 12.6 % (ref 11.6–15.4)
WBC: 5.2 10*3/uL (ref 3.4–10.8)

## 2019-01-09 LAB — LIPID PANEL
Chol/HDL Ratio: 2.9 ratio (ref 0.0–5.0)
Cholesterol, Total: 123 mg/dL (ref 100–199)
HDL: 43 mg/dL (ref >39)
LDL Chol Calc (NIH): 60 mg/dL (ref 0–99)
Triglycerides: 106 mg/dL (ref 0–149)
VLDL Cholesterol Cal: 20 mg/dL (ref 5–40)

## 2019-01-09 LAB — TSH: TSH: 2.54 u[IU]/mL (ref 0.450–4.500)

## 2019-01-30 ENCOUNTER — Telehealth: Payer: Self-pay | Admitting: Cardiovascular Disease

## 2019-01-30 MED ORDER — ROSUVASTATIN CALCIUM 20 MG PO TABS: 20.0000 mg | ORAL_TABLET | Freq: Every day | ORAL | 3 refills | Status: DC

## 2019-01-30 MED ORDER — LEVOTHYROXINE SODIUM 125 MCG PO TABS: 125.0000 ug | ORAL_TABLET | Freq: Every day | ORAL | 3 refills | Status: DC

## 2019-01-30 NOTE — Telephone Encounter (Signed)
*  STAT* If patient is at the pharmacy, call can be transferred to refill team.   1. Which medications need to be refilled? (please list name of each medication and dose if known)  levothyroxine (SYNTHROID) 125 MCG tablet rosuvastatin (CRESTOR) 20 MG tablet  2. Which pharmacy/location (including street and city if local pharmacy) is medication to be sent to? [id number] - [place], [place] - [address]  3. Do they need a 30 day or 90 day supply? 90    Patient states that the Pharmacy faxed over an authorization request for refills, but the pharmacy has not heard anything from the office yet. He only has 3 days of medicine left. He had an appt with Dr. Claiborne Billings 01/08/19.

## 2019-01-30 NOTE — Telephone Encounter (Signed)
Rx(s) sent to pharmacy electronically.  

## 2019-02-14 ENCOUNTER — Telehealth: Payer: Self-pay | Admitting: Cardiovascular Disease

## 2019-02-14 NOTE — Telephone Encounter (Signed)
Pt advised lab results from 01/08/19 he was asking if he needs to worry about a fasting glucose of 101 and I advised him that he should cut back on the "sugar" in his diet and to be sure he is exercising since there is Type 2 DM in his family and to talk with his PCP further.

## 2019-02-14 NOTE — Telephone Encounter (Signed)
Patient returning call for lab results. 

## 2019-03-05 ENCOUNTER — Other Ambulatory Visit: Payer: Self-pay | Admitting: Cardiovascular Disease

## 2019-03-18 ENCOUNTER — Ambulatory Visit: Payer: BC Managed Care – PPO | Admitting: Cardiovascular Disease

## 2019-03-18 ENCOUNTER — Telehealth: Payer: Self-pay | Admitting: Cardiovascular Disease

## 2019-03-18 MED ORDER — SILDENAFIL CITRATE 20 MG PO TABS: 20.0000 mg | ORAL_TABLET | Freq: Every day | ORAL | 7 refills | Status: DC | PRN

## 2019-03-18 NOTE — Telephone Encounter (Signed)
New Message:   Pt says he have a medical question, no further details was given.

## 2019-03-18 NOTE — Telephone Encounter (Signed)
Medication instructions changed per Pharm D.

## 2019-03-18 NOTE — Telephone Encounter (Signed)
Prescription sent to [facility] per pt request.

## 2019-03-18 NOTE — Addendum Note (Signed)
Addended by: Teressa Senter on: 03/18/2019 11:33 AM   Modules accepted: Orders

## 2019-03-18 NOTE — Telephone Encounter (Signed)
Spoke with patient. Patient requesting generic medication for ED. Patient last seen in December. Dr. Marisue Ivan Sildenafil 20mg  daily as needed. Patient updated. Prescription sent to [facility].

## 2019-03-18 NOTE — Addendum Note (Signed)
Addended by: Teressa Senter on: 03/18/2019 11:18 AM   Modules accepted: Orders

## 2019-05-20 DIAGNOSIS — L821 Other seborrheic keratosis: Secondary | ICD-10-CM | POA: Diagnosis not present

## 2019-05-20 DIAGNOSIS — L570 Actinic keratosis: Secondary | ICD-10-CM | POA: Diagnosis not present

## 2019-05-20 DIAGNOSIS — D239 Other benign neoplasm of skin, unspecified: Secondary | ICD-10-CM | POA: Diagnosis not present

## 2019-06-08 LAB — HM COLONOSCOPY

## 2019-06-18 DIAGNOSIS — Z8371 Family history of colonic polyps: Secondary | ICD-10-CM | POA: Diagnosis not present

## 2019-06-18 DIAGNOSIS — K5730 Diverticulosis of large intestine without perforation or abscess without bleeding: Secondary | ICD-10-CM | POA: Diagnosis not present

## 2019-06-18 DIAGNOSIS — Z800 Family history of malignant neoplasm of digestive organs: Secondary | ICD-10-CM | POA: Diagnosis not present

## 2019-06-18 DIAGNOSIS — Z1211 Encounter for screening for malignant neoplasm of colon: Secondary | ICD-10-CM | POA: Diagnosis not present

## 2019-09-20 DIAGNOSIS — Z20822 Contact with and (suspected) exposure to covid-19: Secondary | ICD-10-CM | POA: Diagnosis not present

## 2019-09-20 DIAGNOSIS — Z03818 Encounter for observation for suspected exposure to other biological agents ruled out: Secondary | ICD-10-CM | POA: Diagnosis not present

## 2019-10-15 DIAGNOSIS — Z20822 Contact with and (suspected) exposure to covid-19: Secondary | ICD-10-CM | POA: Diagnosis not present

## 2019-11-12 ENCOUNTER — Ambulatory Visit: Payer: BC Managed Care – PPO | Admitting: Orthopaedic Surgery

## 2019-11-12 ENCOUNTER — Ambulatory Visit: Payer: Self-pay

## 2019-11-12 ENCOUNTER — Encounter: Payer: Self-pay | Admitting: Orthopaedic Surgery

## 2019-11-12 VITALS — Ht 70.5 in | Wt 245.0 lb

## 2019-11-12 DIAGNOSIS — M25561 Pain in right knee: Secondary | ICD-10-CM | POA: Diagnosis not present

## 2019-11-12 NOTE — Progress Notes (Signed)
Office Visit Note   Patient: Brian Mcgee           Date of Birth: Jan 17, 1961           MRN: [id number] Visit Date: 11/12/2019              Requested by: Wilburn Mylar, MD No address on file PCP: Wilburn Mylar, MD   Assessment & Plan: Visit Diagnoses:  1. Right knee pain, unspecified chronicity     Plan: Discussed with him treatment options for his right knee arthritis.  At this point time he would like to stay as conservative as possible.  Therefore recommend quad strengthening exercises are shown to have him demonstrate these back.  We will also have him use Voltaren gel up to 4 g 4 times daily to the right knee.  Weight loss was discussed.  Questions were encouraged and answered by Dr. Magnus Ivan and myself.  We will see him back as needed.  Follow-Up Instructions: Return if symptoms worsen or fail to improve.   Orders:  Orders Placed This Encounter  Procedures   XR Knee 1-2 Views Right   No orders of the defined types were placed in this encounter.     Procedures: No procedures performed   Clinical Data: No additional findings.   Subjective: Chief Complaint  Patient presents with   Right Knee - Pain    HPI Brian Mcgee is a 59 year old male were seen for the first time for right knee pain.  He states that his knee pain began about 3 years ago when he fell off hiking.  States knee pain just has not been right since that time.  He notes that he has occasional giving way of the knee but no catching locking or painful popping.  Pain is mostly medial aspect the knee swells from time to time.  He does have some trouble walking.  Particular days after he has been exercising rides bike often for exercise.  He also walks on a treadmill for exercise.  He notes trouble going up and down stairs.  He also wears knee brace that seems to be beneficial.  Review of Systems Negative for fevers, chills, chest pain, shortness of breath.  Please see HPI otherwise negative or  noncontributory.  Objective: Vital Signs: Ht 5' 10.5" (1.791 m)    Wt 245 lb (111.1 kg)    BMI 34.66 kg/m   Physical Exam Constitutional:      Appearance: He is not ill-appearing or diaphoretic.  Pulmonary:     Effort: Pulmonary effort is normal.  Neurological:     Mental Status: He is alert and oriented to person, place, and time.  Psychiatric:        Mood and Affect: Mood normal.     Ortho Exam Bilateral knees full range of motion.  No instability valgus varus stressing bilateral knees.  Anterior drawer is negative bilaterally McMurray's negative bilaterally.  He has tenderness over the right knee medial joint line.  Slight effusion of the right knee no abnormal warmth erythema of either knee.  Patellofemoral crepitus bilateral knees right greater than left. Specialty Comments:  No specialty comments available.  Imaging: XR Knee 1-2 Views Right  Result Date: 11/12/2019 Right knee AP lateral views: Shows moderate medial joint line narrowing with periarticular osteophytes.  Mild to moderate patellofemoral arthritic changes with osteophytes.  Lateral compartment well-preserved.  Knee is well located.  No acute fractures bony abnormalities otherwise.    PMFS History: Patient  Active Problem List   Diagnosis Date Noted   Essential hypertension 09/27/2012   Hyperlipidemia, mixed 09/27/2012   OSA on CPAP 09/27/2012   GERD (gastroesophageal reflux disease) 09/27/2012   Past Medical History:  Diagnosis Date   Family history of heart disease    Hyperlipidemia    Hypertension    Obesity    OSA (obstructive sleep apnea)    on CPAP    Family History  Problem Relation Age of Onset   Heart failure Father    Heart disease Father        CABG   Hypertension Father    Diabetes Father        Type 2 diabetes   Alzheimer's disease Mother    Hypertension Mother    Diabetes Mother        Type 2 diabetes   Hypertension Sister    Aneurysm Maternal Grandmother      Hypertension Maternal Grandmother    Heart disease Maternal Grandfather        CABG   Cancer Maternal Grandfather        Colon cancer & Leukemia   Diabetes Maternal Grandfather        Type 2 diabetes   Hypertension Maternal Grandfather    Hypertension Paternal Grandmother    Diabetes Paternal Grandmother        Type 1 diabetes   Hernia Paternal Grandmother    Heart attack Paternal Grandmother    Cancer Paternal Grandfather        Lung cancer    Past Surgical History:  Procedure Laterality Date   CARDIOVASCULAR STRESS TEST  11/30/2010   No scintigraphic evidence of inducible myocardial ischemia. EKG negative for ischemia. No ECG changes.   SLEEP STUDY INTERPRETATION  01/14/2011   AHI-30.7/hr, AHI during REM-5.7/hr, RDI-30.7/hr, RDI during REM-5.7/hr, average oxygen saturation during REM and NREM-93%, lowest oxygen saturation during NREM-81%, lowest oxygen saturation during REM-85%,    TRANSTHORACIC ECHOCARDIOGRAM  11/30/2010   EF >55%, normal LV systolic function, normal RV systolic function   Social History   Occupational History   Not on file  Tobacco Use   Smoking status: Never Smoker   Smokeless tobacco: Never Used  Substance and Sexual Activity   Alcohol use: Yes    Comment: socially   Drug use: Not on file   Sexual activity: Not on file

## 2019-11-20 ENCOUNTER — Other Ambulatory Visit: Payer: Self-pay | Admitting: Cardiovascular Disease

## 2019-11-20 MED ORDER — IRBESARTAN 300 MG PO TABS: ORAL_TABLET | ORAL | 0 refills | Status: DC

## 2019-11-20 MED ORDER — AMLODIPINE BESYLATE 5 MG PO TABS: 5.0000 mg | ORAL_TABLET | Freq: Every day | ORAL | 0 refills | Status: DC

## 2019-11-20 NOTE — Telephone Encounter (Signed)
*  STAT* If patient is at the pharmacy, call can be transferred to refill team.   1. Which medications need to be refilled? (please list name of each medication and dose if known)  amLODipine (NORVASC) 5 MG tablet irbesartan (AVAPRO) 300 MG tablet 2. Which pharmacy/location (including street and city if local pharmacy) is medication to be sent to? [id number] - [place],  - [address]  3. Do they need a 30 day or 90 day supply? 90 day supply   Pt is scheduled for an appt to see Dr. Tresa Endo on 03/05/20.

## 2020-01-28 DIAGNOSIS — Z20822 Contact with and (suspected) exposure to covid-19: Secondary | ICD-10-CM | POA: Diagnosis not present

## 2020-02-03 ENCOUNTER — Telehealth: Payer: Self-pay | Admitting: Cardiovascular Disease

## 2020-02-03 NOTE — Telephone Encounter (Signed)
    *  STAT* If patient is at the pharmacy, call can be transferred to refill team.   1. Which medications need to be refilled? (please list name of each medication and dose if known)   rosuvastatin (CRESTOR) 20 MG tablet    levothyroxine (SYNTHROID) 125 MCG tablet     2. Which pharmacy/location (including street and city if local pharmacy) is medication to be sent to? [id number] - [place], [place] - [address]  3. Do they need a 30 day or 90 day supply? 90 days  Pt is almost of of medications

## 2020-02-05 MED ORDER — LEVOTHYROXINE SODIUM 125 MCG PO TABS: 125.0000 ug | ORAL_TABLET | Freq: Every day | ORAL | 0 refills | Status: DC

## 2020-02-05 MED ORDER — ROSUVASTATIN CALCIUM 20 MG PO TABS: 20.0000 mg | ORAL_TABLET | Freq: Every day | ORAL | 0 refills | Status: DC

## 2020-02-05 NOTE — Telephone Encounter (Signed)
Patient is out of meds. Please advise.

## 2020-02-05 NOTE — Telephone Encounter (Signed)
Spoke with patient, advised patient that an appointment with Dr. Tresa Endo would be needed for further refills due to patient not being seen by Dr. Tresa Endo since December of 2020. Appointment made with Dr. Tresa Endo for 03/12/20 at 3:20pm. Advised patient that refill for 1 month have been sent into pharmacy and that patient should keep appointment with Dr. Tresa Endo 2/10 for further refills. Patient verbalized understanding.

## 2020-02-19 ENCOUNTER — Telehealth: Payer: Self-pay | Admitting: Cardiovascular Disease

## 2020-02-19 MED ORDER — IRBESARTAN 300 MG PO TABS: ORAL_TABLET | ORAL | 0 refills | Status: DC

## 2020-02-19 MED ORDER — AMLODIPINE BESYLATE 5 MG PO TABS: 5.0000 mg | ORAL_TABLET | Freq: Every day | ORAL | 0 refills | Status: DC

## 2020-02-19 NOTE — Telephone Encounter (Signed)
Rx(s) sent to pharmacy electronically. Patient has appointment with MD on 03/12/2020

## 2020-02-19 NOTE — Telephone Encounter (Signed)
°*  STAT* If patient is at the pharmacy, call can be transferred to refill team.   1. Which medications need to be refilled? (please list name of each medication and dose if known) amlodipine, irbesartan 300 mg  2. Which pharmacy/location (including street and city if local pharmacy) is medication to be sent to? [address]  3. Do they need a 30 day or 90 day supply? 90

## 2020-03-04 DIAGNOSIS — Z20822 Contact with and (suspected) exposure to covid-19: Secondary | ICD-10-CM | POA: Diagnosis not present

## 2020-03-04 DIAGNOSIS — Z03818 Encounter for observation for suspected exposure to other biological agents ruled out: Secondary | ICD-10-CM | POA: Diagnosis not present

## 2020-03-05 ENCOUNTER — Ambulatory Visit: Payer: Self-pay | Admitting: Cardiovascular Disease

## 2020-03-12 ENCOUNTER — Other Ambulatory Visit: Payer: Self-pay

## 2020-03-12 ENCOUNTER — Ambulatory Visit: Payer: BC Managed Care – PPO | Admitting: Cardiovascular Disease

## 2020-03-12 ENCOUNTER — Encounter: Payer: Self-pay | Admitting: Cardiovascular Disease

## 2020-03-12 VITALS — BP 130/92 | HR 73 | Ht 70.0 in | Wt 247.4 lb

## 2020-03-12 DIAGNOSIS — I10 Essential (primary) hypertension: Secondary | ICD-10-CM

## 2020-03-12 DIAGNOSIS — E668 Other obesity: Secondary | ICD-10-CM

## 2020-03-12 DIAGNOSIS — G4733 Obstructive sleep apnea (adult) (pediatric): Secondary | ICD-10-CM | POA: Diagnosis not present

## 2020-03-12 DIAGNOSIS — E039 Hypothyroidism, unspecified: Secondary | ICD-10-CM

## 2020-03-12 DIAGNOSIS — E782 Mixed hyperlipidemia: Secondary | ICD-10-CM

## 2020-03-12 MED ORDER — ROSUVASTATIN CALCIUM 20 MG PO TABS: 20.0000 mg | ORAL_TABLET | Freq: Every day | ORAL | 3 refills | Status: DC

## 2020-03-12 MED ORDER — AMLODIPINE BESYLATE 5 MG PO TABS: 5.0000 mg | ORAL_TABLET | Freq: Every day | ORAL | 3 refills | Status: DC

## 2020-03-12 NOTE — Patient Instructions (Signed)
Medication Instructions:  Your physician recommends that you continue on your current medications as directed. Please refer to the Current Medication list given to you today.  *If you need a refill on your cardiac medications before your next appointment, please call your pharmacy*  Lab Work: NONE   Testing/Procedures: NONE   Follow-Up: At [facility], you and your health needs are our priority.  As part of our continuing mission to provide you with exceptional heart care, we have created designated Provider Care Teams.  These Care Teams include your primary Cardiologist (physician) and Advanced Practice Providers (APPs -  Physician Assistants and Nurse Practitioners) who all work together to provide you with the care you need, when you need it.  We recommend signing up for the patient portal called "MyChart".  Sign up information is provided on this After Visit Summary.  MyChart is used to connect with patients for Virtual Visits (Telemedicine).  Patients are able to view lab/test results, encounter notes, upcoming appointments, etc.  Non-urgent messages can be sent to your provider as well.   To learn more about what you can do with MyChart, go to [email or web address].    Your next appointment:   6 month(s) You will receive a reminder letter in the mail two months in advance. If you don't receive a letter, please call our office to schedule the follow-up appointment.  The format for your next appointment:   In Person  Provider:   You may see Nicki Guadalajara, MD or one of the following Advanced Practice Providers on your designated Care Team:    Azalee Course, PA-C  Micah Flesher, PA-C or   Judy Pimple, [place]  Other Instructions:  CONTACT [facility] REGARDING YOUR SLEEP
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
## 2020-03-13 ENCOUNTER — Other Ambulatory Visit: Payer: Self-pay | Admitting: Cardiovascular Disease

## 2020-03-13 ENCOUNTER — Other Ambulatory Visit: Payer: Self-pay

## 2020-03-13 DIAGNOSIS — E039 Hypothyroidism, unspecified: Secondary | ICD-10-CM | POA: Diagnosis not present

## 2020-03-13 DIAGNOSIS — I10 Essential (primary) hypertension: Secondary | ICD-10-CM | POA: Diagnosis not present

## 2020-03-13 MED ORDER — LEVOTHYROXINE SODIUM 125 MCG PO TABS: ORAL_TABLET | ORAL | 3 refills | Status: DC

## 2020-03-14 LAB — COMPREHENSIVE METABOLIC PANEL
ALT: 19 IU/L (ref 0–44)
AST: 18 IU/L (ref 0–40)
Albumin/Globulin Ratio: 1.7 (ref 1.2–2.2)
Albumin: 4.3 g/dL (ref 3.8–4.9)
Alkaline Phosphatase: 59 IU/L (ref 44–121)
BUN/Creatinine Ratio: 21 — ABNORMAL HIGH (ref 9–20)
BUN: 17 mg/dL (ref 6–24)
Bilirubin Total: 0.6 mg/dL (ref 0.0–1.2)
CO2: 20 mmol/L (ref 20–29)
Calcium: 9.2 mg/dL (ref 8.7–10.2)
Chloride: 107 mmol/L — ABNORMAL HIGH (ref 96–106)
Creatinine, Ser: 0.82 mg/dL (ref 0.76–1.27)
GFR calc Af Amer: 113 mL/min/{1.73_m2} (ref >59)
GFR calc non Af Amer: 97 mL/min/{1.73_m2} (ref >59)
Globulin, Total: 2.5 g/dL (ref 1.5–4.5)
Glucose: 105 mg/dL — ABNORMAL HIGH (ref 65–99)
Potassium: 4.4 mmol/L (ref 3.5–5.2)
Sodium: 144 mmol/L (ref 134–144)
Total Protein: 6.8 g/dL (ref 6.0–8.5)

## 2020-03-14 LAB — LIPID PANEL
Chol/HDL Ratio: 3.2 ratio (ref 0.0–5.0)
Cholesterol, Total: 127 mg/dL (ref 100–199)
HDL: 40 mg/dL (ref >39)
LDL Chol Calc (NIH): 67 mg/dL (ref 0–99)
Triglycerides: 107 mg/dL (ref 0–149)
VLDL Cholesterol Cal: 20 mg/dL (ref 5–40)

## 2020-03-14 LAB — CBC WITH DIFFERENTIAL/PLATELET
Basophils Absolute: 0 10*3/uL (ref 0.0–0.2)
Basos: 1 %
EOS (ABSOLUTE): 0.1 10*3/uL (ref 0.0–0.4)
Eos: 3 %
Hematocrit: 43.7 % (ref 37.5–51.0)
Hemoglobin: 14.6 g/dL (ref 13.0–17.7)
Immature Grans (Abs): 0 10*3/uL (ref 0.0–0.1)
Immature Granulocytes: 0 %
Lymphocytes Absolute: 1.4 10*3/uL (ref 0.7–3.1)
Lymphs: 37 %
MCH: 30.3 pg (ref 26.6–33.0)
MCHC: 33.4 g/dL (ref 31.5–35.7)
MCV: 91 fL (ref 79–97)
Monocytes Absolute: 0.4 10*3/uL (ref 0.1–0.9)
Monocytes: 9 %
Neutrophils Absolute: 2 10*3/uL (ref 1.4–7.0)
Neutrophils: 50 %
Platelets: 259 10*3/uL (ref 150–450)
RBC: 4.82 x10E6/uL (ref 4.14–5.80)
RDW: 13 % (ref 11.6–15.4)
WBC: 4 10*3/uL (ref 3.4–10.8)

## 2020-03-14 LAB — HEMOGLOBIN A1C
Est. average glucose Bld gHb Est-mCnc: 114 mg/dL
Hgb A1c MFr Bld: 5.6 % (ref 4.8–5.6)

## 2020-03-14 LAB — TSH: TSH: 2.64 u[IU]/mL (ref 0.450–4.500)

## 2020-05-08 ENCOUNTER — Ambulatory Visit: Payer: Self-pay | Admitting: Cardiovascular Disease

## 2020-05-08 MED ORDER — IRBESARTAN 300 MG PO TABS: ORAL_TABLET | ORAL | 3 refills | Status: DC

## 2021-03-16 ENCOUNTER — Ambulatory Visit (HOSPITAL_BASED_OUTPATIENT_CLINIC_OR_DEPARTMENT_OTHER): Payer: BC Managed Care – PPO | Admitting: Family

## 2021-03-16 ENCOUNTER — Encounter (HOSPITAL_BASED_OUTPATIENT_CLINIC_OR_DEPARTMENT_OTHER): Payer: Self-pay | Admitting: Family

## 2021-03-16 ENCOUNTER — Other Ambulatory Visit: Payer: Self-pay

## 2021-03-16 VITALS — BP 134/82 | HR 74 | Ht 70.0 in | Wt 254.0 lb

## 2021-03-16 DIAGNOSIS — Z5181 Encounter for therapeutic drug level monitoring: Secondary | ICD-10-CM | POA: Diagnosis not present

## 2021-03-16 DIAGNOSIS — E782 Mixed hyperlipidemia: Secondary | ICD-10-CM

## 2021-03-16 DIAGNOSIS — Z6836 Body mass index (BMI) 36.0-36.9, adult: Secondary | ICD-10-CM

## 2021-03-16 DIAGNOSIS — G4733 Obstructive sleep apnea (adult) (pediatric): Secondary | ICD-10-CM

## 2021-03-16 DIAGNOSIS — E039 Hypothyroidism, unspecified: Secondary | ICD-10-CM

## 2021-03-16 DIAGNOSIS — I10 Essential (primary) hypertension: Secondary | ICD-10-CM | POA: Diagnosis not present

## 2021-03-16 DIAGNOSIS — N529 Male erectile dysfunction, unspecified: Secondary | ICD-10-CM

## 2021-03-16 MED ORDER — IRBESARTAN 300 MG PO TABS: ORAL_TABLET | ORAL | 3 refills | Status: DC

## 2021-03-16 MED ORDER — SILDENAFIL CITRATE 20 MG PO TABS: 20.0000 mg | ORAL_TABLET | Freq: Every day | ORAL | 2 refills | Status: DC | PRN

## 2021-03-16 MED ORDER — AMLODIPINE BESYLATE 5 MG PO TABS: 5.0000 mg | ORAL_TABLET | Freq: Every day | ORAL | 3 refills | Status: DC

## 2021-03-16 NOTE — Patient Instructions (Addendum)
Medication Instructions:  Continue your current medications.   *If you need a refill on your cardiac medications before your next appointment, please call your pharmacy*   Lab Work: Your physician recommends that you return for lab work today: thyroid panel, CBC, CMP, HDL, A1c  If you have labs (blood work) drawn today and your tests are completely normal, you will receive your results only by: MyChart Message (if you have MyChart) OR A paper copy in the mail If you have any lab test that is abnormal or we need to change your treatment, we will call you to review the results.   Testing/Procedures: Your EKG today shows normal sinus rhythm which is a good result.    Follow-Up: At [facility], you and your health needs are our priority.  As part of our continuing mission to provide you with exceptional heart care, we have created designated Provider Care Teams.  These Care Teams include your primary Cardiologist (physician) and Advanced Practice Providers (APPs -  Physician Assistants and Nurse Practitioners) who all work together to provide you with the care you need, when you need it.  We recommend signing up for the patient portal called "MyChart".  Sign up information is provided on this After Visit Summary.  MyChart is used to connect with patients for Virtual Visits (Telemedicine).  Patients are able to view lab/test results, encounter notes, upcoming appointments, etc.  Non-urgent messages can be sent to your provider as well.   To learn more about what you can do with MyChart, go to [email or web address].    Your next appointment:   6 months  The format for your next appointment:   In Person  Provider:   Nicki Guadalajara, MD or Advanced PracticeProvider    Other Instructions  Heart Healthy Diet Recommendations: A low-salt diet is recommended. Meats should be grilled, baked, or boiled. Avoid fried foods. Focus on lean protein sources like fish or chicken with vegetables and  fruits. The American Heart Association is a Chief Technology Officer!  American Heart Association Diet and Lifeystyle Recommendations    Exercise recommendations: The American Heart Association recommends 150 minutes of moderate intensity exercise weekly. Try 30 minutes of moderate intensity exercise 4-5 times per week. This could include walking, jogging, or swimming.     Tips to Measure your Blood Pressure Correctly  Check your blood pressure twice per week. If you notice your blood pressure is consistently more than 130/80, please let us now.   Here's what you can do to ensure a correct reading:  Don't drink a caffeinated beverage or smoke during the 30 minutes before the test.  Sit quietly for five minutes before the test begins.  During the measurement, sit in a chair with your feet on the floor and your arm supported so your elbow is at about heart level.  The inflatable part of the cuff should completely cover at least 80% of your upper arm, and the cuff should be placed on bare skin, not over a shirt.  Don't talk during the measurement.  In 2017, new guidelines from the American Heart Association, the [employer] of Cardiology, and nine other health organizations lowered the diagnosis of high blood pressure to 130/80 mm Hg or higher for all adults. The guidelines also redefined the various blood pressure categories to now include normal, elevated, Stage 1 hypertension, Stage 2 hypertension, and hypertensive crisis (see "Blood pressure categories").  Blood pressure categories  Blood pressure category SYSTOLIC (upper number)  DIASTOLIC (lower number)  Normal  Less than 120 mm Hg and Less than 80 mm Hg  Elevated 120-129 mm Hg and Less than 80 mm Hg  High blood pressure: Stage 1 hypertension 130-139 mm Hg or 80-89 mm Hg  High blood pressure: Stage 2 hypertension 140 mm Hg or higher or 90 mm Hg or higher  Hypertensive crisis (consult your doctor immediately) Higher than 180 mm Hg and/or  Higher than 120 mm Hg  Source: American Heart Association and American Stroke Association. For more on getting your blood pressure under control, buy Controlling Your Blood Pressure, a Special Health Report from [facility].   Blood Pressure Log   Date   Time  Blood Pressure  Position  Example: Nov 1 9 AM 124/78 sitting

## 2021-03-16 NOTE — Progress Notes (Signed)
Office Visit    Patient Name: Brian Mcgee Date of Encounter: 03/16/2021  PCP:  Wilburn Mylar, MD   [facility]  Cardiologist:  Nicki Guadalajara, MD  Advanced Practice Provider:  No care team member to display Electrophysiologist:  None   Chief Complaint    Brian Mcgee is a 61 y.o. male with a hx of hypertension, hyperlipidemia, family history of coronary disease, OSA on CPAP, obesity, hypothyroidism presents today for follow-up of hypertension and OSA  Past Medical History    Past Medical History:  Diagnosis Date   Family history of heart disease    Hyperlipidemia    Hypertension    Obesity    OSA (obstructive sleep apnea)    on CPAP   Past Surgical History:  Procedure Laterality Date   CARDIOVASCULAR STRESS TEST  11/30/2010   No scintigraphic evidence of inducible myocardial ischemia. EKG negative for ischemia. No ECG changes.   SLEEP STUDY INTERPRETATION  01/14/2011   AHI-30.7/hr, AHI during REM-5.7/hr, RDI-30.7/hr, RDI during REM-5.7/hr, average oxygen saturation during REM and NREM-93%, lowest oxygen saturation during NREM-81%, lowest oxygen saturation during REM-85%,    TRANSTHORACIC ECHOCARDIOGRAM  11/30/2010   EF >55%, normal LV systolic function, normal RV systolic function    Allergies  Allergies  Allergen Reactions   Penicillins Rash    History of Present Illness    Brian Mcgee is a 61 y.o. male with a hx of hypertension, hyperlipidemia, family history of coronary disease, OSA on CPAP, obesity, hypothyroidism  last seen 03/12/20.  Diagnosis severe sleep apnea in 2010.  Previous cough on ACE inhibitor and changed to irbesartan without difficulty.  He had COVID-19 January 2020 which did not require admission.  He was last seen 03/12/2020.  At that time had CPAP had been recalled and he was encouraged to contact his DME company for replacement.  He presents today for follow-up. Shares with me that he and his wife are  preparing for a cruise to [place], [place] so he has started back to walking. Notes weight remains labile. Eats mostly at home but notes indiscretion related to portion sizes. Reports no shortness of breath nor dyspnea on exertion. Reports no chest pain, pressure, or tightness. No edema, orthopnea, PND. Reports no palpitations.  Has not been using CPAP, has not gotten replacement for [employer] device on recall.  Reviewed email with him from [employer] -given he has not had updated settings in greater than 1 year AutoPap is reasonable.  EKGs/Labs/Other Studies Reviewed:   The following studies were reviewed today:  Carotid duplex 02/2016 Mild carotid plaque, less than 39% bilaterally.   EKG:  EKG is  ordered today.  The ekg ordered today demonstrates NSR 74 bpm with no acute ST/T wave changes.   Recent Labs: No results found for requested labs within last 8760 hours.  Recent Lipid Panel    Component Value Date/Time   CHOL 127 03/13/2020 1046   CHOL 145 09/27/2012 1137   TRIG 107 03/13/2020 1046   TRIG 105 09/27/2012 1137   HDL 40 03/13/2020 1046   HDL 47 09/27/2012 1137   CHOLHDL 3.2 03/13/2020 1046   CHOLHDL 3.3 02/04/2016 0929   VLDL 28 02/04/2016 0929   LDLCALC 67 03/13/2020 1046   LDLCALC 77 09/27/2012 1137    Home Medications   Current Meds  Medication Sig   amLODipine (NORVASC) 5 MG tablet Take 1 tablet (5 mg total) by mouth daily.   aspirin 81  MG tablet Take 81 mg by mouth daily.   esomeprazole (NEXIUM) 20 MG capsule Take 20 mg by mouth daily at 12 noon.   irbesartan (AVAPRO) 300 MG tablet TAKE ONE TABLET BY MOUTH DAILY   levothyroxine (SYNTHROID) 125 MCG tablet TAKE 1 TABLET(125 MCG) BY MOUTH DAILY BEFORE BREAKFAST   OVER THE COUNTER MEDICATION CPAP therapy   rosuvastatin (CRESTOR) 20 MG tablet Take 1 tablet (20 mg total) by mouth daily.   sildenafil (REVATIO) 20 MG tablet Take 1 tablet (20 mg total) by mouth daily as needed. Take 1-3 tablets daily as needed      Review of Systems      All other systems reviewed and are otherwise negative except as noted above.  Physical Exam    VS:  BP 134/82    Pulse 74    Ht 5\' 10"  (1.778 m)    Wt 254 lb (115.2 kg)    SpO2 97%    BMI 36.45 kg/m  , BMI Body mass index is 36.45 kg/m.  Wt Readings from Last 3 Encounters:  03/16/21 254 lb (115.2 kg)  03/12/20 247 lb 6.4 oz (112.2 kg)  11/12/19 245 lb (111.1 kg)     GEN: Well nourished, well developed, in no acute distress. HEENT: normal. Neck: Supple, no JVD, carotid bruits, or masses. Cardiac: RRR, no murmurs, rubs, or gallops. No clubbing, cyanosis, edema.  Radials/PT 2+ and equal bilaterally.  Respiratory:  Respirations regular and unlabored, clear to auscultation bilaterally. GI: Soft, nontender, nondistended. MS: No deformity or atrophy. Skin: Warm and dry, no rash. Neuro:  Strength and sensation are intact. Psych: Normal affect.  Assessment & Plan    Hypertension - BP mildly elevated.  Encouraged to monitor at home and report BP consistently greater than 130/80.  In the meantime he will continue irbesartan 300 mg daily and amlodipine 5 mg daily.  If BP persistently elevated plan to increase amlodipine to 10 mg.  OSA - CPAP compliance encouraged. Has not used his CPAP in awhile.  Encouraged to follow through with 01/12/20 and getting AutoPap replacement.  Hyperlipidemia - Update lipid panel, CMP today. Continue Crestor 20mg  QD. Denies myalgias.   Obesity - Weight loss via diet and exercise encouraged. Discussed the impact being overweight would have on cardiovascular risk. Update A1c today. If A1c elevated, consider addition of Ozempic.   Hypothyroidism -Update thyroid panel, CMP today.  Update levothyroxine dosing pending results.   Disposition: Follow up in 6 month(s) with [facility], MD or APP.  Signed, , NP 03/16/2021, 9:24 AM [facility]

## 2021-03-17 ENCOUNTER — Encounter (HOSPITAL_BASED_OUTPATIENT_CLINIC_OR_DEPARTMENT_OTHER): Payer: Self-pay

## 2021-03-17 LAB — LIPID PANEL
Chol/HDL Ratio: 3 ratio (ref 0.0–5.0)
Cholesterol, Total: 119 mg/dL (ref 100–199)
HDL: 40 mg/dL (ref >39)
LDL Chol Calc (NIH): 60 mg/dL (ref 0–99)
Triglycerides: 101 mg/dL (ref 0–149)
VLDL Cholesterol Cal: 19 mg/dL (ref 5–40)

## 2021-03-17 LAB — COMPREHENSIVE METABOLIC PANEL
ALT: 21 IU/L (ref 0–44)
AST: 18 IU/L (ref 0–40)
Albumin/Globulin Ratio: 1.7 (ref 1.2–2.2)
Albumin: 4.3 g/dL (ref 3.8–4.9)
Alkaline Phosphatase: 63 IU/L (ref 44–121)
BUN/Creatinine Ratio: 13 (ref 9–20)
BUN: 13 mg/dL (ref 6–24)
Bilirubin Total: 0.5 mg/dL (ref 0.0–1.2)
CO2: 25 mmol/L (ref 20–29)
Calcium: 9.3 mg/dL (ref 8.7–10.2)
Chloride: 105 mmol/L (ref 96–106)
Creatinine, Ser: 1.04 mg/dL (ref 0.76–1.27)
Globulin, Total: 2.5 g/dL (ref 1.5–4.5)
Glucose: 116 mg/dL — ABNORMAL HIGH (ref 70–99)
Potassium: 4.8 mmol/L (ref 3.5–5.2)
Sodium: 143 mmol/L (ref 134–144)
Total Protein: 6.8 g/dL (ref 6.0–8.5)
eGFR: 83 mL/min/{1.73_m2} (ref >59)

## 2021-03-17 LAB — CBC
Hematocrit: 46.3 % (ref 37.5–51.0)
Hemoglobin: 15.2 g/dL (ref 13.0–17.7)
MCH: 29.3 pg (ref 26.6–33.0)
MCHC: 32.8 g/dL (ref 31.5–35.7)
MCV: 89 fL (ref 79–97)
Platelets: 289 10*3/uL (ref 150–450)
RBC: 5.18 x10E6/uL (ref 4.14–5.80)
RDW: 12.6 % (ref 11.6–15.4)
WBC: 3.9 10*3/uL (ref 3.4–10.8)

## 2021-03-17 LAB — THYROID PANEL WITH TSH
Free Thyroxine Index: 2.2 (ref 1.2–4.9)
T3 Uptake Ratio: 27 % (ref 24–39)
T4, Total: 8 ug/dL (ref 4.5–12.0)
TSH: 2.68 u[IU]/mL (ref 0.450–4.500)

## 2021-03-17 LAB — HEMOGLOBIN A1C
Est. average glucose Bld gHb Est-mCnc: 117 mg/dL
Hgb A1c MFr Bld: 5.7 % — ABNORMAL HIGH (ref 4.8–5.6)

## 2021-03-17 MED ORDER — ROSUVASTATIN CALCIUM 20 MG PO TABS
20.0000 mg | ORAL_TABLET | Freq: Every day | ORAL | 3 refills | Status: DC
Start: 2021-03-17 — End: 2022-03-09

## 2021-03-17 MED ORDER — LEVOTHYROXINE SODIUM 125 MCG PO TABS: ORAL_TABLET | ORAL | 3 refills | Status: DC

## 2021-03-17 NOTE — Addendum Note (Signed)
Addended by: Loel Dubonnet on: 03/17/2021 07:26 AM   Modules accepted: Orders

## 2021-04-20 DIAGNOSIS — H52203 Unspecified astigmatism, bilateral: Secondary | ICD-10-CM | POA: Diagnosis not present

## 2021-06-08 ENCOUNTER — Ambulatory Visit: Payer: BC Managed Care – PPO | Admitting: Cardiovascular Disease

## 2021-07-01 ENCOUNTER — Ambulatory Visit: Payer: BC Managed Care – PPO | Admitting: Internal Medicine

## 2021-07-01 ENCOUNTER — Ambulatory Visit (INDEPENDENT_AMBULATORY_CARE_PROVIDER_SITE_OTHER): Payer: BC Managed Care – PPO

## 2021-07-01 ENCOUNTER — Encounter: Payer: Self-pay | Admitting: Internal Medicine

## 2021-07-01 VITALS — BP 128/80 | HR 84 | Temp 98.7°F | Ht 70.0 in | Wt 244.0 lb

## 2021-07-01 DIAGNOSIS — Z0001 Encounter for general adult medical examination with abnormal findings: Secondary | ICD-10-CM | POA: Insufficient documentation

## 2021-07-01 DIAGNOSIS — E039 Hypothyroidism, unspecified: Secondary | ICD-10-CM | POA: Insufficient documentation

## 2021-07-01 DIAGNOSIS — M25511 Pain in right shoulder: Secondary | ICD-10-CM

## 2021-07-01 DIAGNOSIS — M25522 Pain in left elbow: Secondary | ICD-10-CM

## 2021-07-01 DIAGNOSIS — Z1159 Encounter for screening for other viral diseases: Secondary | ICD-10-CM | POA: Insufficient documentation

## 2021-07-01 DIAGNOSIS — R972 Elevated prostate specific antigen [PSA]: Secondary | ICD-10-CM

## 2021-07-01 DIAGNOSIS — Z23 Encounter for immunization: Secondary | ICD-10-CM

## 2021-07-01 DIAGNOSIS — L989 Disorder of the skin and subcutaneous tissue, unspecified: Secondary | ICD-10-CM | POA: Diagnosis not present

## 2021-07-01 DIAGNOSIS — G8929 Other chronic pain: Secondary | ICD-10-CM | POA: Diagnosis not present

## 2021-07-01 LAB — PSA: PSA: 6.12 ng/mL — ABNORMAL HIGH (ref 0.10–4.00)

## 2021-07-01 LAB — TSH: TSH: 5.06 u[IU]/mL (ref 0.35–5.50)

## 2021-07-01 NOTE — Patient Instructions (Signed)
Health Maintenance, Male Adopting a healthy lifestyle and getting preventive care are important in promoting health and wellness. Ask your health care provider about: The right schedule for you to have regular tests and exams. Things you can do on your own to prevent diseases and keep yourself healthy. What should I know about diet, weight, and exercise? Eat a healthy diet  Eat a diet that includes plenty of vegetables, fruits, low-fat dairy products, and lean protein. Do not eat a lot of foods that are high in solid fats, added sugars, or sodium. Maintain a healthy weight Body mass index (BMI) is a measurement that can be used to identify possible weight problems. It estimates body fat based on height and weight. Your health care provider can help determine your BMI and help you achieve or maintain a healthy weight. Get regular exercise Get regular exercise. This is one of the most important things you can do for your health. Most adults should: Exercise for at least 150 minutes each week. The exercise should increase your heart rate and make you sweat (moderate-intensity exercise). Do strengthening exercises at least twice a week. This is in addition to the moderate-intensity exercise. Spend less time sitting. Even light physical activity can be beneficial. Watch cholesterol and blood lipids Have your blood tested for lipids and cholesterol at 59 years of age, then have this test every 5 years. You may need to have your cholesterol levels checked more often if: Your lipid or cholesterol levels are high. You are older than 59 years of age. You are at high risk for heart disease. What should I know about cancer screening? Many types of cancers can be detected early and may often be prevented. Depending on your health history and family history, you may need to have cancer screening at various ages. This may include screening for: Colorectal cancer. Prostate cancer. Skin cancer. Lung  cancer. What should I know about heart disease, diabetes, and high blood pressure? Blood pressure and heart disease High blood pressure causes heart disease and increases the risk of stroke. This is more likely to develop in people who have high blood pressure readings or are overweight. Talk with your health care provider about your target blood pressure readings. Have your blood pressure checked: Every 3-5 years if you are 18-39 years of age. Every year if you are 40 years old or older. If you are between the ages of 65 and 75 and are a current or former smoker, ask your health care provider if you should have a one-time screening for abdominal aortic aneurysm (AAA). Diabetes Have regular diabetes screenings. This checks your fasting blood sugar level. Have the screening done: Once every three years after age 45 if you are at a normal weight and have a low risk for diabetes. More often and at a younger age if you are overweight or have a high risk for diabetes. What should I know about preventing infection? Hepatitis B If you have a higher risk for hepatitis B, you should be screened for this virus. Talk with your health care provider to find out if you are at risk for hepatitis B infection. Hepatitis C Blood testing is recommended for: Everyone born from 1945 through 1965. Anyone with known risk factors for hepatitis C. Sexually transmitted infections (STIs) You should be screened each year for STIs, including gonorrhea and chlamydia, if: You are sexually active and are younger than 59 years of age. You are older than 59 years of age and your   health care provider tells you that you are at risk for this type of infection. Your sexual activity has changed since you were last screened, and you are at increased risk for chlamydia or gonorrhea. Ask your health care provider if you are at risk. Ask your health care provider about whether you are at high risk for HIV. Your health care provider  may recommend a prescription medicine to help prevent HIV infection. If you choose to take medicine to prevent HIV, you should first get tested for HIV. You should then be tested every 3 months for as long as you are taking the medicine. Follow these instructions at home: Alcohol use Do not drink alcohol if your health care provider tells you not to drink. If you drink alcohol: Limit how much you have to 0-2 drinks a day. Know how much alcohol is in your drink. In the [place] one drink equals one 12 oz bottle of beer (355 mL), one 5 oz glass of wine (148 mL), or one 1 oz glass of hard liquor (44 mL). Lifestyle Do not use any products that contain nicotine or tobacco. These products include cigarettes, chewing tobacco, and vaping devices, such as e-cigarettes. If you need help quitting, ask your health care provider. Do not use street drugs. Do not share needles. Ask your health care provider for help if you need support or information about quitting drugs. General instructions Schedule regular health, dental, and eye exams. Stay current with your vaccines. Tell your health care provider if: You often feel depressed. You have ever been abused or do not feel safe at home. Summary Adopting a healthy lifestyle and getting preventive care are important in promoting health and wellness. Follow your health care provider's instructions about healthy diet, exercising, and getting tested or screened for diseases. Follow your health care provider's instructions on monitoring your cholesterol and blood pressure. This information is not intended to replace advice given to you by your health care provider. Make sure you discuss any questions you have with your health care provider. Document Revised: 06/08/2020 Document Reviewed: 06/08/2020 Elsevier Patient Education  2023 Elsevier Inc.  

## 2021-07-01 NOTE — Progress Notes (Signed)
Subjective:  Patient ID: Brian Mcgee, male    DOB: 11-25-1960  Age: 61 y.o. MRN: [id number]  CC: Osteoarthritis, Annual Exam, Hypertension, Hyperlipidemia, and Hypothyroidism   HPI FARON TUDISCO presents for a CPX and to establish.  He walks on a treadmill and does not experience DOE, CP, SOB, edema, or diaphoresis. He has some lesions on his scalp and wants to see a dermatologist. He complains of a 6 month hx of pain in his large joints but more so in his left elbow and right shoulder.  History Dracen has a past medical history of Family history of heart disease, Hyperlipidemia, Hypertension, Obesity, and OSA (obstructive sleep apnea).   He has a past surgical history that includes SLEEP STUDY INTERPRETATION (01/14/2011); Cardiovascular stress test (11/30/2010); and transthoracic echocardiogram (11/30/2010).   His family history includes Alzheimer's disease in his mother; Aneurysm in his maternal grandmother; Cancer in his maternal grandfather and paternal grandfather; Diabetes in his father, maternal grandfather, mother, and paternal grandmother; Heart attack in his paternal grandmother; Heart disease in his father and maternal grandfather; Heart failure in his father; Hernia in his paternal grandmother; Hypertension in his father, maternal grandfather, maternal grandmother, mother, paternal grandmother, and sister.He reports that he has never smoked. He has never used smokeless tobacco. He reports that he does not currently use alcohol. He reports that he does not use drugs.  Outpatient Medications Prior to Visit  Medication Sig Dispense Refill   amLODipine (NORVASC) 5 MG tablet Take 1 tablet (5 mg total) by mouth daily. 90 tablet 3   aspirin 81 MG tablet Take 81 mg by mouth daily.     esomeprazole (NEXIUM) 20 MG capsule Take 20 mg by mouth daily at 12 noon.     irbesartan (AVAPRO) 300 MG tablet TAKE ONE TABLET BY MOUTH DAILY 90 tablet 3   OVER THE COUNTER MEDICATION CPAP  therapy     rosuvastatin (CRESTOR) 20 MG tablet Take 1 tablet (20 mg total) by mouth daily. 90 tablet 3   sildenafil (REVATIO) 20 MG tablet Take 1 tablet (20 mg total) by mouth daily as needed. Take 1-3 tablets daily as needed 30 tablet 2   levothyroxine (SYNTHROID) 125 MCG tablet TAKE 1 TABLET(125 MCG) BY MOUTH DAILY BEFORE BREAKFAST 90 tablet 3   No facility-administered medications prior to visit.    ROS Review of Systems  Constitutional:  Positive for fatigue. Negative for appetite change, chills, diaphoresis and fever.  HENT: Negative.    Eyes: Negative.   Respiratory:  Negative for cough, chest tightness, shortness of breath and wheezing.   Cardiovascular:  Negative for chest pain, palpitations and leg swelling.  Gastrointestinal:  Negative for abdominal pain, constipation, diarrhea, nausea and vomiting.  Endocrine: Negative.   Genitourinary: Negative.  Negative for difficulty urinating.  Musculoskeletal:  Positive for arthralgias. Negative for joint swelling and myalgias.  Skin: Negative.   Neurological:  Negative for dizziness, weakness, light-headedness and headaches.  Hematological:  Negative for adenopathy. Does not bruise/bleed easily.  Psychiatric/Behavioral: Negative.      Objective:  BP 128/80 (BP Location: Right Arm, Patient Position: Sitting, Cuff Size: Large)   Pulse 84   Temp 98.7 F (37.1 C) (Oral)   Ht 5\' 10"  (1.778 m)   Wt 244 lb (110.7 kg)   SpO2 95%   BMI 35.01 kg/m   Physical Exam Vitals reviewed.  Constitutional:      Appearance: He is not ill-appearing.  HENT:     Nose: Nose normal.  Mouth/Throat:     Mouth: Mucous membranes are moist.  Eyes:     General: No scleral icterus.    Conjunctiva/sclera: Conjunctivae normal.  Cardiovascular:     Rate and Rhythm: Normal rate and regular rhythm.     Heart sounds: No murmur heard. Pulmonary:     Effort: Pulmonary effort is normal.     Breath sounds: No stridor. No wheezing, rhonchi or rales.   Abdominal:     General: Abdomen is flat.     Palpations: There is no mass.     Tenderness: There is no abdominal tenderness. There is no guarding.     Hernia: No hernia is present.  Musculoskeletal:        General: Normal range of motion.     Cervical back: Neck supple.     Right lower leg: No edema.     Left lower leg: No edema.  Lymphadenopathy:     Cervical: No cervical adenopathy.  Skin:    General: Skin is warm and dry.     Coloration: Skin is not pale.  Neurological:     General: No focal deficit present.     Mental Status: He is alert. Mental status is at baseline.  Psychiatric:        Mood and Affect: Mood normal.        Behavior: Behavior normal.     Lab Results  Component Value Date   WBC 3.9 03/16/2021   HGB 15.2 03/16/2021   HCT 46.3 03/16/2021   PLT 289 03/16/2021   GLUCOSE 116 (H) 03/16/2021   CHOL 119 03/16/2021   TRIG 101 03/16/2021   HDL 40 03/16/2021   LDLCALC 60 03/16/2021   ALT 21 03/16/2021   AST 18 03/16/2021   NA 143 03/16/2021   K 4.8 03/16/2021   CL 105 03/16/2021   CREATININE 1.04 03/16/2021   BUN 13 03/16/2021   CO2 25 03/16/2021   TSH 5.06 07/01/2021   PSA 6.12 (H) 07/01/2021   HGBA1C 5.7 (H) 03/16/2021     No results found.   Assessment & Plan:   Bhavya was seen today for osteoarthritis, annual exam, hypertension, hyperlipidemia and hypothyroidism.  Diagnoses and all orders for this visit:  Encounter for general adult medical examination with abnormal findings- Exam completed, labs reviewed, vaccines reviewed and updated, cancer screenings are up-to-date, patient education was given. -     PSA; Future -     Hepatitis C antibody; Future -     HIV Antibody (routine testing w rflx); Future -     HIV Antibody (routine testing w rflx) -     Hepatitis C antibody -     PSA  Need for hepatitis C screening test -     Hepatitis C antibody; Future -     HIV Antibody (routine testing w rflx); Future -     HIV Antibody (routine  testing w rflx) -     Hepatitis C antibody  Hypothyroidism, unspecified type- He complains of fatigue and his TSH is slightly elevated.  Will increase the dose of T4. -     TSH; Future -     TSH -     levothyroxine (SYNTHROID) 150 MCG tablet; Take 1 tablet (150 mcg total) by mouth daily.  Chronic elbow pain, left- His symptoms are consistent with osteoarthritis.  Will treat if he desires to. -     DG Elbow Complete Left; Future  Chronic right shoulder pain -     DG  Shoulder Right; Future  Skin lesion of scalp -     Ambulatory referral to Dermatology  PSA elevation- Will recheck this in 3 months.  Need for prophylactic vaccination and inoculation against varicella -     Zoster Vaccine Adjuvanted [facility](SHINGRIX) injection; Inject 0.5 mLs into the muscle once for 1 dose.   I have discontinued Topher L. Heffern's levothyroxine. I am also having him start on levothyroxine and Shingrix. Additionally, I am having him maintain his aspirin, OVER THE COUNTER MEDICATION, esomeprazole, sildenafil, irbesartan, amLODipine, and rosuvastatin.  Meds ordered this encounter  Medications   levothyroxine (SYNTHROID) 150 MCG tablet    Sig: Take 1 tablet (150 mcg total) by mouth daily.    Dispense:  90 tablet    Refill:  0   Zoster Vaccine Adjuvanted [facility](SHINGRIX) injection    Sig: Inject 0.5 mLs into the muscle once for 1 dose.    Dispense:  0.5 mL    Refill:  1     Follow-up: Return in about 6 months (around 12/31/2021).  Sanda Lingerhomas Kimla Furth, MD

## 2021-07-02 LAB — HEPATITIS C ANTIBODY
Hepatitis C Ab: NONREACTIVE
SIGNAL TO CUT-OFF: 0.1 (ref <1.00)

## 2021-07-02 LAB — HIV ANTIBODY (ROUTINE TESTING W REFLEX): HIV 1&2 Ab, 4th Generation: NONREACTIVE

## 2021-07-05 ENCOUNTER — Encounter: Payer: Self-pay | Admitting: Internal Medicine

## 2021-07-05 DIAGNOSIS — L989 Disorder of the skin and subcutaneous tissue, unspecified: Secondary | ICD-10-CM | POA: Insufficient documentation

## 2021-07-08 DIAGNOSIS — R972 Elevated prostate specific antigen [PSA]: Secondary | ICD-10-CM | POA: Insufficient documentation

## 2021-07-08 MED ORDER — LEVOTHYROXINE SODIUM 150 MCG PO TABS: 150.0000 ug | ORAL_TABLET | Freq: Every day | ORAL | 0 refills | Status: DC

## 2021-07-09 MED ORDER — SHINGRIX 50 MCG/0.5ML IM SUSR: 0.5000 mL | Freq: Once | INTRAMUSCULAR | 1 refills | Status: AC

## 2021-09-06 ENCOUNTER — Ambulatory Visit: Payer: BC Managed Care – PPO | Admitting: Internal Medicine

## 2021-09-06 ENCOUNTER — Encounter: Payer: Self-pay | Admitting: Internal Medicine

## 2021-09-06 VITALS — BP 128/80 | HR 78 | Temp 97.6°F | Resp 16 | Ht 70.0 in | Wt 249.0 lb

## 2021-09-06 DIAGNOSIS — I10 Essential (primary) hypertension: Secondary | ICD-10-CM | POA: Diagnosis not present

## 2021-09-06 DIAGNOSIS — Z23 Encounter for immunization: Secondary | ICD-10-CM | POA: Diagnosis not present

## 2021-09-06 DIAGNOSIS — R972 Elevated prostate specific antigen [PSA]: Secondary | ICD-10-CM

## 2021-09-06 LAB — PSA: PSA: 2.11 ng/mL (ref 0.10–4.00)

## 2021-09-06 NOTE — Patient Instructions (Signed)
Hypertension, Adult High blood pressure (hypertension) is when the force of blood pumping through the arteries is too strong. The arteries are the blood vessels that carry blood from the heart throughout the body. Hypertension forces the heart to work harder to pump blood and may cause arteries to become narrow or stiff. Untreated or uncontrolled hypertension can lead to a heart attack, heart failure, a stroke, kidney disease, and other problems. A blood pressure reading consists of a higher number over a lower number. Ideally, your blood pressure should be below 120/80. The first ("top") number is called the systolic pressure. It is a measure of the pressure in your arteries as your heart beats. The second ("bottom") number is called the diastolic pressure. It is a measure of the pressure in your arteries as the heart relaxes. What are the causes? The exact cause of this condition is not known. There are some conditions that result in high blood pressure. What increases the risk? Certain factors may make you more likely to develop high blood pressure. Some of these risk factors are under your control, including: Smoking. Not getting enough exercise or physical activity. Being overweight. Having too much fat, sugar, calories, or salt (sodium) in your diet. Drinking too much alcohol. Other risk factors include: Having a personal history of heart disease, diabetes, high cholesterol, or kidney disease. Stress. Having a family history of high blood pressure and high cholesterol. Having obstructive sleep apnea. Age. The risk increases with age. What are the signs or symptoms? High blood pressure may not cause symptoms. Very high blood pressure (hypertensive crisis) may cause: Headache. Fast or irregular heartbeats (palpitations). Shortness of breath. Nosebleed. Nausea and vomiting. Vision changes. Severe chest pain, dizziness, and seizures. How is this diagnosed? This condition is diagnosed by  measuring your blood pressure while you are seated, with your arm resting on a flat surface, your legs uncrossed, and your feet flat on the floor. The cuff of the blood pressure monitor will be placed directly against the skin of your upper arm at the level of your heart. Blood pressure should be measured at least twice using the same arm. Certain conditions can cause a difference in blood pressure between your right and left arms. If you have a high blood pressure reading during one visit or you have normal blood pressure with other risk factors, you may be asked to: Return on a different day to have your blood pressure checked again. Monitor your blood pressure at home for 1 week or longer. If you are diagnosed with hypertension, you may have other blood or imaging tests to help your health care provider understand your overall risk for other conditions. How is this treated? This condition is treated by making healthy lifestyle changes, such as eating healthy foods, exercising more, and reducing your alcohol intake. You may be referred for counseling on a healthy diet and physical activity. Your health care provider may prescribe medicine if lifestyle changes are not enough to get your blood pressure under control and if: Your systolic blood pressure is above 130. Your diastolic blood pressure is above 80. Your personal target blood pressure may vary depending on your medical conditions, your age, and other factors. Follow these instructions at home: Eating and drinking  Eat a diet that is high in fiber and potassium, and low in sodium, added sugar, and fat. An example of this eating plan is called the DASH diet. DASH stands for Dietary Approaches to Stop Hypertension. To eat this way: Eat   plenty of fresh fruits and vegetables. Try to fill one half of your plate at each meal with fruits and vegetables. Eat whole grains, such as whole-wheat pasta, brown rice, or whole-grain bread. Fill about one  fourth of your plate with whole grains. Eat or drink low-fat dairy products, such as skim milk or low-fat yogurt. Avoid fatty cuts of meat, processed or cured meats, and poultry with skin. Fill about one fourth of your plate with lean proteins, such as fish, chicken without skin, beans, eggs, or tofu. Avoid pre-made and processed foods. These tend to be higher in sodium, added sugar, and fat. Reduce your daily sodium intake. Many people with hypertension should eat less than 1,500 mg of sodium a day. Do not drink alcohol if: Your health care provider tells you not to drink. You are pregnant, may be pregnant, or are planning to become pregnant. If you drink alcohol: Limit how much you have to: 0-1 drink a day for women. 0-2 drinks a day for men. Know how much alcohol is in your drink. In the [place] one drink equals one 12 oz bottle of beer (355 mL), one 5 oz glass of wine (148 mL), or one 1 oz glass of hard liquor (44 mL). Lifestyle  Work with your health care provider to maintain a healthy body weight or to lose weight. Ask what an ideal weight is for you. Get at least 30 minutes of exercise that causes your heart to beat faster (aerobic exercise) most days of the week. Activities may include walking, swimming, or biking. Include exercise to strengthen your muscles (resistance exercise), such as Pilates or lifting weights, as part of your weekly exercise routine. Try to do these types of exercises for 30 minutes at least 3 days a week. Do not use any products that contain nicotine or tobacco. These products include cigarettes, chewing tobacco, and vaping devices, such as e-cigarettes. If you need help quitting, ask your health care provider. Monitor your blood pressure at home as told by your health care provider. Keep all follow-up visits. This is important. Medicines Take over-the-counter and prescription medicines only as told by your health care provider. Follow directions carefully. Blood  pressure medicines must be taken as prescribed. Do not skip doses of blood pressure medicine. Doing this puts you at risk for problems and can make the medicine less effective. Ask your health care provider about side effects or reactions to medicines that you should watch for. Contact a health care provider if you: Think you are having a reaction to a medicine you are taking. Have headaches that keep coming back (recurring). Feel dizzy. Have swelling in your ankles. Have trouble with your vision. Get help right away if you: Develop a severe headache or confusion. Have unusual weakness or numbness. Feel faint. Have severe pain in your chest or abdomen. Vomit repeatedly. Have trouble breathing. These symptoms may be an emergency. Get help right away. Call 911. Do not wait to see if the symptoms will go away. Do not drive yourself to the hospital. Summary Hypertension is when the force of blood pumping through your arteries is too strong. If this condition is not controlled, it may put you at risk for serious complications. Your personal target blood pressure may vary depending on your medical conditions, your age, and other factors. For most people, a normal blood pressure is less than 120/80. Hypertension is treated with lifestyle changes, medicines, or a combination of both. Lifestyle changes include losing weight, eating a healthy,   low-sodium diet, exercising more, and limiting alcohol. This information is not intended to replace advice given to you by your health care provider. Make sure you discuss any questions you have with your health care provider. Document Revised: 11/24/2020 Document Reviewed: 11/24/2020 Elsevier Patient Education  2023 [employer]vier Inc.  

## 2021-09-06 NOTE — Progress Notes (Signed)
Subjective:  Patient ID: Brian Mcgee, male    DOB: 07-15-1960  Age: 61 y.o. MRN: [id number]  CC: Hypertension   HPI [name] presents for a f/up -  He walks about 2 miles a day and works out on a treadmill.  He denies chest pain, shortness of breath, diaphoresis, edema, or fatigue.  Outpatient Medications Prior to Visit  Medication Sig Dispense Refill   amLODipine (NORVASC) 5 MG tablet Take 1 tablet (5 mg total) by mouth daily. 90 tablet 3   aspirin 81 MG tablet Take 81 mg by mouth daily.     esomeprazole (NEXIUM) 20 MG capsule Take 20 mg by mouth daily at 12 noon.     irbesartan (AVAPRO) 300 MG tablet TAKE ONE TABLET BY MOUTH DAILY 90 tablet 3   levothyroxine (SYNTHROID) 150 MCG tablet Take 1 tablet (150 mcg total) by mouth daily. 90 tablet 0   OVER THE COUNTER MEDICATION CPAP therapy     rosuvastatin (CRESTOR) 20 MG tablet Take 1 tablet (20 mg total) by mouth daily. 90 tablet 3   sildenafil (REVATIO) 20 MG tablet Take 1 tablet (20 mg total) by mouth daily as needed. Take 1-3 tablets daily as needed 30 tablet 2   No facility-administered medications prior to visit.    ROS Review of Systems  Constitutional: Negative.  Negative for diaphoresis and fatigue.  HENT: Negative.    Eyes: Negative.   Respiratory:  Negative for cough, chest tightness, shortness of breath and wheezing.   Cardiovascular:  Negative for chest pain, palpitations and leg swelling.  Gastrointestinal:  Negative for abdominal pain, diarrhea, nausea and vomiting.  Endocrine: Negative.   Genitourinary:  Negative for difficulty urinating, dysuria and hematuria.  Musculoskeletal: Negative.  Negative for arthralgias.  Skin: Negative.   Allergic/Immunologic: Negative.   Neurological: Negative.  Negative for dizziness, weakness and light-headedness.  Hematological:  Negative for adenopathy. Does not bruise/bleed easily.  Psychiatric/Behavioral: Negative.      Objective:  BP 128/80 (BP Location:  Left Arm, Patient Position: Sitting, Cuff Size: Large)   Pulse 78   Temp 97.6 F (36.4 C) (Oral)   Resp 16   Ht 5\' 10"  (1.778 m)   Wt 249 lb (112.9 kg)   SpO2 97%   BMI 35.73 kg/m   BP Readings from Last 3 Encounters:  09/06/21 128/80  07/01/21 128/80  03/16/21 134/82    Wt Readings from Last 3 Encounters:  09/06/21 249 lb (112.9 kg)  07/01/21 244 lb (110.7 kg)  03/16/21 254 lb (115.2 kg)    Physical Exam Vitals reviewed.  HENT:     Nose: Nose normal.     Mouth/Throat:     Mouth: Mucous membranes are moist.  Eyes:     General: No scleral icterus.    Conjunctiva/sclera: Conjunctivae normal.  Cardiovascular:     Rate and Rhythm: Normal rate and regular rhythm.     Heart sounds: No murmur heard. Pulmonary:     Effort: Pulmonary effort is normal.     Breath sounds: No stridor. No wheezing, rhonchi or rales.  Abdominal:     General: Abdomen is flat.     Palpations: There is no mass.     Tenderness: There is no abdominal tenderness. There is no guarding.     Hernia: No hernia is present.  Musculoskeletal:        General: Normal range of motion.     Cervical back: Neck supple.     Right lower leg:  No edema.     Left lower leg: No edema.  Lymphadenopathy:     Cervical: No cervical adenopathy.  Skin:    General: Skin is warm and dry.  Neurological:     General: No focal deficit present.     Mental Status: He is alert. Mental status is at baseline.  Psychiatric:        Mood and Affect: Mood normal.        Behavior: Behavior normal.     Lab Results  Component Value Date   WBC 3.9 03/16/2021   HGB 15.2 03/16/2021   HCT 46.3 03/16/2021   PLT 289 03/16/2021   GLUCOSE 86 09/06/2021   CHOL 119 03/16/2021   TRIG 101 03/16/2021   HDL 40 03/16/2021   LDLCALC 60 03/16/2021   ALT 21 03/16/2021   AST 18 03/16/2021   NA 141 09/06/2021   K 4.0 09/06/2021   CL 104 09/06/2021   CREATININE 0.95 09/06/2021   BUN 12 09/06/2021   CO2 24 09/06/2021   TSH 5.06  07/01/2021   PSA 2.11 09/06/2021   HGBA1C 5.7 (H) 03/16/2021    No results found.  Assessment & Plan:   Dylon was seen today for hypertension.  Diagnoses and all orders for this visit:  Essential hypertension- His blood pressure is well-controlled.  Electrolytes and renal function are normal. -     Basic metabolic panel; Future -     Basic metabolic panel  PSA elevation- His PSA is normal now. -     PSA; Future -     PSA  Need for vaccination -     Zoster Recombinant (Shingrix )   I am having Earle Gell maintain his aspirin, OVER THE COUNTER MEDICATION, esomeprazole, sildenafil, irbesartan, amLODipine, rosuvastatin, and levothyroxine.  No orders of the defined types were placed in this encounter.    Follow-up: Return in about 6 months (around 03/09/2022).  Sanda Linger, MD

## 2021-09-07 LAB — BASIC METABOLIC PANEL
BUN: 12 mg/dL (ref 6–23)
CO2: 24 mEq/L (ref 19–32)
Calcium: 9 mg/dL (ref 8.4–10.5)
Chloride: 104 mEq/L (ref 96–112)
Creatinine, Ser: 0.95 mg/dL (ref 0.40–1.50)
GFR: 87.61 mL/min (ref >60.00)
Glucose, Bld: 86 mg/dL (ref 70–99)
Potassium: 4 mEq/L (ref 3.5–5.1)
Sodium: 141 mEq/L (ref 135–145)

## 2021-10-13 ENCOUNTER — Other Ambulatory Visit: Payer: Self-pay | Admitting: Internal Medicine

## 2021-10-13 DIAGNOSIS — E039 Hypothyroidism, unspecified: Secondary | ICD-10-CM

## 2021-11-17 ENCOUNTER — Ambulatory Visit (INDEPENDENT_AMBULATORY_CARE_PROVIDER_SITE_OTHER): Payer: BC Managed Care – PPO | Admitting: *Deleted

## 2021-11-17 DIAGNOSIS — Z23 Encounter for immunization: Secondary | ICD-10-CM | POA: Diagnosis not present

## 2021-11-17 NOTE — Progress Notes (Signed)
Administered 2nd shingles shot left deltoid. Pt tolerated well.

## 2021-11-18 DIAGNOSIS — L814 Other melanin hyperpigmentation: Secondary | ICD-10-CM | POA: Diagnosis not present

## 2021-11-18 DIAGNOSIS — L738 Other specified follicular disorders: Secondary | ICD-10-CM | POA: Diagnosis not present

## 2021-11-18 DIAGNOSIS — C44519 Basal cell carcinoma of skin of other part of trunk: Secondary | ICD-10-CM | POA: Diagnosis not present

## 2021-11-18 DIAGNOSIS — L570 Actinic keratosis: Secondary | ICD-10-CM | POA: Diagnosis not present

## 2021-11-18 DIAGNOSIS — L821 Other seborrheic keratosis: Secondary | ICD-10-CM | POA: Diagnosis not present

## 2021-11-18 DIAGNOSIS — D225 Melanocytic nevi of trunk: Secondary | ICD-10-CM | POA: Diagnosis not present

## 2022-01-11 ENCOUNTER — Other Ambulatory Visit: Payer: Self-pay | Admitting: Internal Medicine

## 2022-01-11 DIAGNOSIS — E039 Hypothyroidism, unspecified: Secondary | ICD-10-CM

## 2022-01-12 ENCOUNTER — Other Ambulatory Visit: Payer: Self-pay | Admitting: Internal Medicine

## 2022-01-12 DIAGNOSIS — E039 Hypothyroidism, unspecified: Secondary | ICD-10-CM

## 2022-01-12 MED ORDER — LEVOTHYROXINE SODIUM 150 MCG PO TABS: ORAL_TABLET | ORAL | 0 refills | Status: DC

## 2022-03-09 ENCOUNTER — Encounter: Payer: Self-pay | Admitting: Internal Medicine

## 2022-03-09 ENCOUNTER — Ambulatory Visit: Payer: BC Managed Care – PPO | Admitting: Internal Medicine

## 2022-03-09 VITALS — BP 146/92 | HR 85 | Temp 98.3°F | Resp 16 | Ht 70.0 in | Wt 255.0 lb

## 2022-03-09 DIAGNOSIS — Z23 Encounter for immunization: Secondary | ICD-10-CM

## 2022-03-09 DIAGNOSIS — I10 Essential (primary) hypertension: Secondary | ICD-10-CM | POA: Diagnosis not present

## 2022-03-09 DIAGNOSIS — K219 Gastro-esophageal reflux disease without esophagitis: Secondary | ICD-10-CM | POA: Diagnosis not present

## 2022-03-09 DIAGNOSIS — E039 Hypothyroidism, unspecified: Secondary | ICD-10-CM

## 2022-03-09 DIAGNOSIS — Z6835 Body mass index (BMI) 35.0-35.9, adult: Secondary | ICD-10-CM | POA: Diagnosis not present

## 2022-03-09 DIAGNOSIS — E785 Hyperlipidemia, unspecified: Secondary | ICD-10-CM

## 2022-03-09 LAB — HEPATIC FUNCTION PANEL
ALT: 17 U/L (ref 0–53)
AST: 15 U/L (ref 0–37)
Albumin: 4.5 g/dL (ref 3.5–5.2)
Alkaline Phosphatase: 59 U/L (ref 39–117)
Bilirubin, Direct: 0.1 mg/dL (ref 0.0–0.3)
Total Bilirubin: 0.4 mg/dL (ref 0.2–1.2)
Total Protein: 7.6 g/dL (ref 6.0–8.3)

## 2022-03-09 LAB — CBC WITH DIFFERENTIAL/PLATELET
Basophils Absolute: 0 10*3/uL (ref 0.0–0.1)
Basophils Relative: 0.6 % (ref 0.0–3.0)
Eosinophils Absolute: 0.3 10*3/uL (ref 0.0–0.7)
Eosinophils Relative: 4.6 % (ref 0.0–5.0)
HCT: 44.2 % (ref 39.0–52.0)
Hemoglobin: 15.2 g/dL (ref 13.0–17.0)
Lymphocytes Relative: 34.7 % (ref 12.0–46.0)
Lymphs Abs: 1.9 10*3/uL (ref 0.7–4.0)
MCHC: 34.3 g/dL (ref 30.0–36.0)
MCV: 89.6 fl (ref 78.0–100.0)
Monocytes Absolute: 0.6 10*3/uL (ref 0.1–1.0)
Monocytes Relative: 10.8 % (ref 3.0–12.0)
Neutro Abs: 2.7 10*3/uL (ref 1.4–7.7)
Neutrophils Relative %: 49.3 % (ref 43.0–77.0)
Platelets: 287 10*3/uL (ref 150.0–400.0)
RBC: 4.93 Mil/uL (ref 4.22–5.81)
RDW: 13.8 % (ref 11.5–15.5)
WBC: 5.5 10*3/uL (ref 4.0–10.5)

## 2022-03-09 LAB — HEMOGLOBIN A1C: Hgb A1c MFr Bld: 5.6 % (ref 4.6–6.5)

## 2022-03-09 LAB — BASIC METABOLIC PANEL
BUN: 15 mg/dL (ref 6–23)
CO2: 29 mEq/L (ref 19–32)
Calcium: 9.2 mg/dL (ref 8.4–10.5)
Chloride: 104 mEq/L (ref 96–112)
Creatinine, Ser: 1.08 mg/dL (ref 0.40–1.50)
GFR: 74.85 mL/min (ref >60.00)
Glucose, Bld: 104 mg/dL — ABNORMAL HIGH (ref 70–99)
Potassium: 3.9 mEq/L (ref 3.5–5.1)
Sodium: 140 mEq/L (ref 135–145)

## 2022-03-09 LAB — TSH: TSH: 1.81 u[IU]/mL (ref 0.35–5.50)

## 2022-03-09 MED ORDER — IRBESARTAN 300 MG PO TABS: ORAL_TABLET | ORAL | 0 refills | Status: DC

## 2022-03-09 MED ORDER — ESOMEPRAZOLE MAGNESIUM 40 MG PO CPDR: 40.0000 mg | DELAYED_RELEASE_CAPSULE | Freq: Every day | ORAL | 0 refills | Status: DC

## 2022-03-09 MED ORDER — INDAPAMIDE 1.25 MG PO TABS: 1.2500 mg | ORAL_TABLET | Freq: Every day | ORAL | 0 refills | Status: DC

## 2022-03-09 MED ORDER — ROSUVASTATIN CALCIUM 20 MG PO TABS: 20.0000 mg | ORAL_TABLET | Freq: Every day | ORAL | 0 refills | Status: DC

## 2022-03-09 NOTE — Progress Notes (Signed)
Subjective:  Patient ID: Brian Mcgee, male    DOB: 1960/12/29  Age: 62 y.o. MRN: [id number]  CC: Hypertension, Hypothyroidism, Gastroesophageal Reflux, and Hyperlipidemia   HPI Brian Mcgee presents for f/up -  He is active and denies chest pain, shortness of breath, or diaphoresis.  He complains of heartburn and says he is not getting much symptom relief with 20 mg of Nexium once a day.  He has been adding Mylanta.  Outpatient Medications Prior to Visit  Medication Sig Dispense Refill   aspirin 81 MG tablet Take 81 mg by mouth daily.     levothyroxine (SYNTHROID) 150 MCG tablet TAKE 1 TABLET(150 MCG) BY MOUTH DAILY 90 tablet 0   OVER THE COUNTER MEDICATION CPAP therapy     sildenafil (REVATIO) 20 MG tablet Take 1 tablet (20 mg total) by mouth daily as needed. Take 1-3 tablets daily as needed 30 tablet 2   amLODipine (NORVASC) 5 MG tablet Take 1 tablet (5 mg total) by mouth daily. 90 tablet 3   esomeprazole (NEXIUM) 20 MG capsule Take 20 mg by mouth daily at 12 noon.     irbesartan (AVAPRO) 300 MG tablet TAKE ONE TABLET BY MOUTH DAILY 90 tablet 3   rosuvastatin (CRESTOR) 20 MG tablet Take 1 tablet (20 mg total) by mouth daily. 90 tablet 3   No facility-administered medications prior to visit.    ROS Review of Systems  Constitutional:  Positive for unexpected weight change (wt gain). Negative for chills, diaphoresis and fatigue.  HENT: Negative.  Negative for sore throat, trouble swallowing and voice change.   Respiratory: Negative.  Negative for cough, chest tightness, shortness of breath and wheezing.   Cardiovascular:  Positive for leg swelling. Negative for chest pain and palpitations.  Gastrointestinal:  Negative for abdominal pain, constipation, diarrhea, nausea and vomiting.  Endocrine: Negative.   Genitourinary: Negative.  Negative for difficulty urinating.  Musculoskeletal:  Positive for arthralgias. Negative for myalgias.  Skin: Negative.   Neurological:  Negative.  Negative for dizziness, weakness and light-headedness.  Hematological:  Negative for adenopathy. Does not bruise/bleed easily.  Psychiatric/Behavioral: Negative.      Objective:  BP (!) 146/92 (BP Location: Right Arm, Patient Position: Sitting, Cuff Size: Large)   Pulse 85   Temp 98.3 F (36.8 C) (Oral)   Resp 16   Ht 5' 10"$  (1.778 m)   Wt 255 lb (115.7 kg)   SpO2 97%   BMI 36.59 kg/m   BP Readings from Last 3 Encounters:  03/09/22 (!) 146/92  09/06/21 128/80  07/01/21 128/80    Wt Readings from Last 3 Encounters:  03/09/22 255 lb (115.7 kg)  09/06/21 249 lb (112.9 kg)  07/01/21 244 lb (110.7 kg)    Physical Exam Vitals reviewed.  Constitutional:      Appearance: He is obese. He is not ill-appearing.  HENT:     Nose: Nose normal.     Mouth/Throat:     Mouth: Mucous membranes are moist.  Eyes:     General: No scleral icterus.    Conjunctiva/sclera: Conjunctivae normal.  Cardiovascular:     Rate and Rhythm: Normal rate and regular rhythm.     Heart sounds: No murmur heard.    No gallop.  Pulmonary:     Effort: Pulmonary effort is normal.     Breath sounds: No stridor. No wheezing, rhonchi or rales.  Abdominal:     General: Abdomen is protuberant. Bowel sounds are normal. There is no distension.  Palpations: Abdomen is soft. There is no hepatomegaly, splenomegaly or mass.     Tenderness: There is no abdominal tenderness.  Musculoskeletal:        General: No deformity. Normal range of motion.     Cervical back: Neck supple.     Right lower leg: Edema (trace pitting) present.     Left lower leg: Edema (trace pitting) present.  Lymphadenopathy:     Cervical: No cervical adenopathy.  Skin:    General: Skin is warm and dry.     Findings: No rash.  Neurological:     General: No focal deficit present.     Mental Status: He is alert. Mental status is at baseline.  Psychiatric:        Mood and Affect: Mood normal.        Behavior: Behavior normal.      Lab Results  Component Value Date   WBC 5.5 03/09/2022   HGB 15.2 03/09/2022   HCT 44.2 03/09/2022   PLT 287.0 03/09/2022   GLUCOSE 104 (H) 03/09/2022   CHOL 119 03/16/2021   TRIG 101 03/16/2021   HDL 40 03/16/2021   LDLCALC 60 03/16/2021   ALT 17 03/09/2022   AST 15 03/09/2022   NA 140 03/09/2022   K 3.9 03/09/2022   CL 104 03/09/2022   CREATININE 1.08 03/09/2022   BUN 15 03/09/2022   CO2 29 03/09/2022   TSH 1.81 03/09/2022   PSA 2.11 09/06/2021   HGBA1C 5.6 03/09/2022    No results found.  Assessment & Plan:   Gilead was seen today for hypertension, hypothyroidism, gastroesophageal reflux and hyperlipidemia.  Diagnoses and all orders for this visit:  Gastroesophageal reflux disease without esophagitis- Will increase the dose of the PPI. -     CBC with Differential/Platelet; Future -     esomeprazole (NEXIUM) 40 MG capsule; Take 1 capsule (40 mg total) by mouth daily. -     CBC with Differential/Platelet  Essential hypertension- His blood pressure is not adequately well-controlled and he has lower extremity edema.  Will discontinue the CCB and will start a thiazide diuretic in addition to the ARB. -     Basic metabolic panel; Future -     CBC with Differential/Platelet; Future -     TSH; Future -     Hepatic function panel; Future -     irbesartan (AVAPRO) 300 MG tablet; TAKE ONE TABLET BY MOUTH DAILY -     indapamide (LOZOL) 1.25 MG tablet; Take 1 tablet (1.25 mg total) by mouth daily. -     Hepatic function panel -     TSH -     CBC with Differential/Platelet -     Basic metabolic panel  Hypothyroidism, unspecified type- He is euthyroid. -     TSH; Future -     TSH  Class 2 severe obesity due to excess calories with serious comorbidity and body mass index (BMI) of 35.0 to 35.9 in adult (HCC) -     Hemoglobin A1c; Future -     Hemoglobin A1c  Dyslipidemia, goal LDL below 100 -     rosuvastatin (CRESTOR) 20 MG tablet; Take 1 tablet (20 mg total) by  mouth daily.  Other orders -     Flu Vaccine QUAD 6+ mos PF IM (Fluarix Quad PF)   I have discontinued Gwyndolyn Saxon L. Pellecchia's esomeprazole and amLODipine. I am also having him start on indapamide and esomeprazole. Additionally, I am having him maintain his aspirin, OVER  THE COUNTER MEDICATION, sildenafil, levothyroxine, rosuvastatin, and irbesartan.  Meds ordered this encounter  Medications   rosuvastatin (CRESTOR) 20 MG tablet    Sig: Take 1 tablet (20 mg total) by mouth daily.    Dispense:  90 tablet    Refill:  0   irbesartan (AVAPRO) 300 MG tablet    Sig: TAKE ONE TABLET BY MOUTH DAILY    Dispense:  90 tablet    Refill:  0   indapamide (LOZOL) 1.25 MG tablet    Sig: Take 1 tablet (1.25 mg total) by mouth daily.    Dispense:  90 tablet    Refill:  0   esomeprazole (NEXIUM) 40 MG capsule    Sig: Take 1 capsule (40 mg total) by mouth daily.    Dispense:  90 capsule    Refill:  0     Follow-up: Return in about 3 months (around 06/07/2022).  Scarlette Calico, MD

## 2022-03-09 NOTE — Patient Instructions (Signed)
Hypertension, Adult High blood pressure (hypertension) is when the force of blood pumping through the arteries is too strong. The arteries are the blood vessels that carry blood from the heart throughout the body. Hypertension forces the heart to work harder to pump blood and may cause arteries to become narrow or stiff. Untreated or uncontrolled hypertension can lead to a heart attack, heart failure, a stroke, kidney disease, and other problems. A blood pressure reading consists of a higher number over a lower number. Ideally, your blood pressure should be below 120/80. The first ("top") number is called the systolic pressure. It is a measure of the pressure in your arteries as your heart beats. The second ("bottom") number is called the diastolic pressure. It is a measure of the pressure in your arteries as the heart relaxes. What are the causes? The exact cause of this condition is not known. There are some conditions that result in high blood pressure. What increases the risk? Certain factors may make you more likely to develop high blood pressure. Some of these risk factors are under your control, including: Smoking. Not getting enough exercise or physical activity. Being overweight. Having too much fat, sugar, calories, or salt (sodium) in your diet. Drinking too much alcohol. Other risk factors include: Having a personal history of heart disease, diabetes, high cholesterol, or kidney disease. Stress. Having a family history of high blood pressure and high cholesterol. Having obstructive sleep apnea. Age. The risk increases with age. What are the signs or symptoms? High blood pressure may not cause symptoms. Very high blood pressure (hypertensive crisis) may cause: Headache. Fast or irregular heartbeats (palpitations). Shortness of breath. Nosebleed. Nausea and vomiting. Vision changes. Severe chest pain, dizziness, and seizures. How is this diagnosed? This condition is diagnosed by  measuring your blood pressure while you are seated, with your arm resting on a flat surface, your legs uncrossed, and your feet flat on the floor. The cuff of the blood pressure monitor will be placed directly against the skin of your upper arm at the level of your heart. Blood pressure should be measured at least twice using the same arm. Certain conditions can cause a difference in blood pressure between your right and left arms. If you have a high blood pressure reading during one visit or you have normal blood pressure with other risk factors, you may be asked to: Return on a different day to have your blood pressure checked again. Monitor your blood pressure at home for 1 week or longer. If you are diagnosed with hypertension, you may have other blood or imaging tests to help your health care provider understand your overall risk for other conditions. How is this treated? This condition is treated by making healthy lifestyle changes, such as eating healthy foods, exercising more, and reducing your alcohol intake. You may be referred for counseling on a healthy diet and physical activity. Your health care provider may prescribe medicine if lifestyle changes are not enough to get your blood pressure under control and if: Your systolic blood pressure is above 130. Your diastolic blood pressure is above 80. Your personal target blood pressure may vary depending on your medical conditions, your age, and other factors. Follow these instructions at home: Eating and drinking  Eat a diet that is high in fiber and potassium, and low in sodium, added sugar, and fat. An example of this eating plan is called the DASH diet. DASH stands for Dietary Approaches to Stop Hypertension. To eat this way: Eat   plenty of fresh fruits and vegetables. Try to fill one half of your plate at each meal with fruits and vegetables. Eat whole grains, such as whole-wheat pasta, brown rice, or whole-grain bread. Fill about one  fourth of your plate with whole grains. Eat or drink low-fat dairy products, such as skim milk or low-fat yogurt. Avoid fatty cuts of meat, processed or cured meats, and poultry with skin. Fill about one fourth of your plate with lean proteins, such as fish, chicken without skin, beans, eggs, or tofu. Avoid pre-made and processed foods. These tend to be higher in sodium, added sugar, and fat. Reduce your daily sodium intake. Many people with hypertension should eat less than 1,500 mg of sodium a day. Do not drink alcohol if: Your health care provider tells you not to drink. You are pregnant, may be pregnant, or are planning to become pregnant. If you drink alcohol: Limit how much you have to: 0-1 drink a day for women. 0-2 drinks a day for men. Know how much alcohol is in your drink. In the [place] one drink equals one 12 oz bottle of beer (355 mL), one 5 oz glass of wine (148 mL), or one 1 oz glass of hard liquor (44 mL). Lifestyle  Work with your health care provider to maintain a healthy body weight or to lose weight. Ask what an ideal weight is for you. Get at least 30 minutes of exercise that causes your heart to beat faster (aerobic exercise) most days of the week. Activities may include walking, swimming, or biking. Include exercise to strengthen your muscles (resistance exercise), such as Pilates or lifting weights, as part of your weekly exercise routine. Try to do these types of exercises for 30 minutes at least 3 days a week. Do not use any products that contain nicotine or tobacco. These products include cigarettes, chewing tobacco, and vaping devices, such as e-cigarettes. If you need help quitting, ask your health care provider. Monitor your blood pressure at home as told by your health care provider. Keep all follow-up visits. This is important. Medicines Take over-the-counter and prescription medicines only as told by your health care provider. Follow directions carefully. Blood  pressure medicines must be taken as prescribed. Do not skip doses of blood pressure medicine. Doing this puts you at risk for problems and can make the medicine less effective. Ask your health care provider about side effects or reactions to medicines that you should watch for. Contact a health care provider if you: Think you are having a reaction to a medicine you are taking. Have headaches that keep coming back (recurring). Feel dizzy. Have swelling in your ankles. Have trouble with your vision. Get help right away if you: Develop a severe headache or confusion. Have unusual weakness or numbness. Feel faint. Have severe pain in your chest or abdomen. Vomit repeatedly. Have trouble breathing. These symptoms may be an emergency. Get help right away. Call 911. Do not wait to see if the symptoms will go away. Do not drive yourself to the hospital. Summary Hypertension is when the force of blood pumping through your arteries is too strong. If this condition is not controlled, it may put you at risk for serious complications. Your personal target blood pressure may vary depending on your medical conditions, your age, and other factors. For most people, a normal blood pressure is less than 120/80. Hypertension is treated with lifestyle changes, medicines, or a combination of both. Lifestyle changes include losing weight, eating a healthy,   low-sodium diet, exercising more, and limiting alcohol. This information is not intended to replace advice given to you by your health care provider. Make sure you discuss any questions you have with your health care provider. Document Revised: 11/24/2020 Document Reviewed: 11/24/2020 Elsevier Patient Education  2023 [employer]vier Inc.  

## 2022-04-09 ENCOUNTER — Other Ambulatory Visit: Payer: Self-pay | Admitting: Internal Medicine

## 2022-04-09 DIAGNOSIS — E039 Hypothyroidism, unspecified: Secondary | ICD-10-CM

## 2022-04-14 ENCOUNTER — Encounter: Payer: Self-pay | Admitting: Internal Medicine

## 2022-04-14 ENCOUNTER — Telehealth: Payer: Self-pay

## 2022-04-14 DIAGNOSIS — Z7189 Other specified counseling: Secondary | ICD-10-CM | POA: Diagnosis not present

## 2022-04-14 DIAGNOSIS — Z23 Encounter for immunization: Secondary | ICD-10-CM | POA: Diagnosis not present

## 2022-04-14 NOTE — Telephone Encounter (Signed)
Note written

## 2022-04-14 NOTE — Telephone Encounter (Signed)
[facility] called requesting letter stating that pt can receive live and inactivated vaccines due to pt having an upcoming trip to [place].  Pt has appt today 3/15 @ [facility]# [id number] Fax [phone]

## 2022-04-14 NOTE — Telephone Encounter (Signed)
Letter has been faxed.

## 2022-05-06 ENCOUNTER — Telehealth: Payer: Self-pay | Admitting: Internal Medicine

## 2022-05-06 NOTE — Telephone Encounter (Signed)
Prescription Request  05/06/2022  LOV: 03/09/2022  What is the name of the medication or equipment? sildenafil (REVATIO) 20 MG tablet   Have you contacted your pharmacy to request a refill? No   Which pharmacy would you like this sent to?  [id number] - [place], [place] - [address] [place] [address] Phone: [phone] Fax: [phone]    Patient notified that their request is being sent to the clinical staff for review and that they should receive a response within 2 business days.   Please advise at Mobile [phone] (mobile)   Next OV is 05/30/22.

## 2022-05-09 ENCOUNTER — Other Ambulatory Visit: Payer: Self-pay | Admitting: Internal Medicine

## 2022-05-09 ENCOUNTER — Telehealth: Payer: Self-pay | Admitting: Internal Medicine

## 2022-05-09 DIAGNOSIS — N529 Male erectile dysfunction, unspecified: Secondary | ICD-10-CM

## 2022-05-09 MED ORDER — SILDENAFIL CITRATE 20 MG PO TABS: 20.0000 mg | ORAL_TABLET | Freq: Every day | ORAL | 2 refills | Status: DC | PRN

## 2022-05-09 NOTE — Telephone Encounter (Signed)
Called [facility] spoke w/ Imani inform her MD response on instructions Take 1-3 tablets daily as needed.Marland KitchenRaechel Chute

## 2022-05-09 NOTE — Telephone Encounter (Signed)
Patient's pharmacy called - they need to clarify the directions on the sidinifil  Please call - [facility]                     [phone]

## 2022-05-30 ENCOUNTER — Ambulatory Visit (INDEPENDENT_AMBULATORY_CARE_PROVIDER_SITE_OTHER): Payer: BC Managed Care – PPO

## 2022-05-30 ENCOUNTER — Encounter: Payer: Self-pay | Admitting: Internal Medicine

## 2022-05-30 ENCOUNTER — Ambulatory Visit: Payer: BC Managed Care – PPO | Admitting: Internal Medicine

## 2022-05-30 VITALS — BP 134/86 | HR 75 | Temp 98.0°F | Ht 70.0 in | Wt 248.0 lb

## 2022-05-30 DIAGNOSIS — E785 Hyperlipidemia, unspecified: Secondary | ICD-10-CM | POA: Diagnosis not present

## 2022-05-30 DIAGNOSIS — M25561 Pain in right knee: Secondary | ICD-10-CM

## 2022-05-30 DIAGNOSIS — E039 Hypothyroidism, unspecified: Secondary | ICD-10-CM

## 2022-05-30 DIAGNOSIS — K219 Gastro-esophageal reflux disease without esophagitis: Secondary | ICD-10-CM

## 2022-05-30 DIAGNOSIS — I10 Essential (primary) hypertension: Secondary | ICD-10-CM | POA: Diagnosis not present

## 2022-05-30 DIAGNOSIS — G8929 Other chronic pain: Secondary | ICD-10-CM

## 2022-05-30 LAB — LIPID PANEL
Cholesterol: 117 mg/dL (ref 0–200)
HDL: 37.7 mg/dL — ABNORMAL LOW (ref >39.00)
LDL Cholesterol: 57 mg/dL (ref 0–99)
NonHDL: 78.87
Total CHOL/HDL Ratio: 3
Triglycerides: 111 mg/dL (ref 0.0–149.0)
VLDL: 22.2 mg/dL (ref 0.0–40.0)

## 2022-05-30 NOTE — Progress Notes (Unsigned)
Subjective:  Patient ID: Brian Mcgee, male    DOB: [birth year]  Age: 62 y.o. MRN: [id number]  CC: Hypertension and Hyperlipidemia   HPI Brian Mcgee presents for f/up ---  He walks about 6 miles every other day.  He walks on an incline.  His endurance is good.  He denies chest pain, shortness of breath, diaphoresis, or edema.  He complains of worsening right knee pain but denies instability or sensation of clicking or locking.  Outpatient Medications Prior to Visit  Medication Sig Dispense Refill   aspirin 81 MG tablet Take 81 mg by mouth daily.     irbesartan (AVAPRO) 300 MG tablet TAKE ONE TABLET BY MOUTH DAILY 90 tablet 0   levothyroxine (SYNTHROID) 150 MCG tablet TAKE 1 TABLET(150 MCG) BY MOUTH DAILY 90 tablet 0   OVER THE COUNTER MEDICATION CPAP therapy     sildenafil (REVATIO) 20 MG tablet Take 1 tablet (20 mg total) by mouth daily as needed. Take 1-3 tablets daily as needed 30 tablet 2   esomeprazole (NEXIUM) 40 MG capsule Take 1 capsule (40 mg total) by mouth daily. 90 capsule 0   indapamide (LOZOL) 1.25 MG tablet Take 1 tablet (1.25 mg total) by mouth daily. 90 tablet 0   rosuvastatin (CRESTOR) 20 MG tablet Take 1 tablet (20 mg total) by mouth daily. 90 tablet 0   No facility-administered medications prior to visit.    ROS Review of Systems  Constitutional: Negative.  Negative for diaphoresis and fatigue.  HENT: Negative.    Eyes: Negative.   Respiratory:  Negative for chest tightness, shortness of breath and wheezing.   Cardiovascular:  Negative for chest pain, palpitations and leg swelling.  Gastrointestinal:  Negative for abdominal pain, constipation, diarrhea, nausea and vomiting.  Endocrine: Negative.   Genitourinary: Negative.  Negative for difficulty urinating and dysuria.  Musculoskeletal:  Positive for arthralgias. Negative for back pain, myalgias and neck pain.  Skin: Negative.   Neurological: Negative.  Negative for dizziness, weakness and  light-headedness.  Hematological:  Negative for adenopathy. Does not bruise/bleed easily.  Psychiatric/Behavioral: Negative.  Negative for agitation.     Objective:  BP 134/86 (BP Location: Left Arm, Patient Position: Sitting, Cuff Size: Large)   Pulse 75   Temp 98 F (36.7 C) (Oral)   Ht 5\' 10"  (1.778 m)   Wt 248 lb (112.5 kg)   SpO2 97%   BMI 35.58 kg/m   BP Readings from Last 3 Encounters:  05/30/22 134/86  03/09/22 (!) 146/92  09/06/21 128/80    Wt Readings from Last 3 Encounters:  05/30/22 248 lb (112.5 kg)  03/09/22 255 lb (115.7 kg)  09/06/21 249 lb (112.9 kg)    Physical Exam Vitals reviewed.  Constitutional:      Appearance: Normal appearance.  HENT:     Nose: Nose normal.     Mouth/Throat:     Mouth: Mucous membranes are moist.  Eyes:     General: No scleral icterus.    Conjunctiva/sclera: Conjunctivae normal.  Cardiovascular:     Rate and Rhythm: Normal rate and regular rhythm.     Heart sounds: No murmur heard. Pulmonary:     Effort: Pulmonary effort is normal.     Breath sounds: No stridor. No wheezing, rhonchi or rales.  Abdominal:     General: Abdomen is flat.     Palpations: There is no mass.     Tenderness: There is no abdominal tenderness. There is no guarding.  Hernia: No hernia is present.  Musculoskeletal:        General: Deformity present. No swelling. Normal range of motion.     Cervical back: Neck supple.     Right knee: Deformity (DJD) and crepitus present. No swelling, effusion, erythema, ecchymosis or bony tenderness. Normal range of motion. No tenderness.     Left knee: No swelling, deformity, effusion, erythema, ecchymosis or bony tenderness. Normal range of motion. No tenderness.     Right lower leg: No edema.     Left lower leg: No edema.  Lymphadenopathy:     Cervical: No cervical adenopathy.  Skin:    General: Skin is warm.  Neurological:     General: No focal deficit present.     Mental Status: He is alert. Mental  status is at baseline.  Psychiatric:        Mood and Affect: Mood normal.        Behavior: Behavior normal.     Lab Results  Component Value Date   WBC 5.5 03/09/2022   HGB 15.2 03/09/2022   HCT 44.2 03/09/2022   PLT 287.0 03/09/2022   GLUCOSE 104 (H) 03/09/2022   CHOL 117 05/30/2022   TRIG 111.0 05/30/2022   HDL 37.70 (L) 05/30/2022   LDLCALC 57 05/30/2022   ALT 17 03/09/2022   AST 15 03/09/2022   NA 140 03/09/2022   K 3.9 03/09/2022   CL 104 03/09/2022   CREATININE 1.08 03/09/2022   BUN 15 03/09/2022   CO2 29 03/09/2022   TSH 1.81 03/09/2022   PSA 2.11 09/06/2021   HGBA1C 5.6 03/09/2022    DG Knee Complete 4 Views Right  Result Date: 06/01/2022 CLINICAL DATA:  Chronic right knee pain. History of right tibial fracture. EXAM: RIGHT KNEE - COMPLETE 4+ VIEW COMPARISON:  Right knee radiographs 11/12/2019 FINDINGS: Moderate to severe medial compartment joint space narrowing with moderate peripheral degenerative osteophytosis. Moderate superior and mild inferior patellar degenerative osteophytes. At least mild patellofemoral joint space narrowing. No joint effusion. No acute fracture or dislocation. IMPRESSION: Moderate to severe medial compartment and moderate patellofemoral compartment osteoarthritis. Electronically Signed   By: Neita Garnet M.D.   On: 06/01/2022 17:54     Assessment & Plan:   Chronic pain of right knee- Will refer to Ortho when he is ready. -     DG Knee Complete 4 Views Right; Future  Essential hypertension- His blood pressure is well-controlled. -     Indapamide; Take 1 tablet (1.25 mg total) by mouth daily.  Dispense: 90 tablet; Refill: 1  Hypothyroidism, unspecified type- He is euthyroid.  Dyslipidemia, goal LDL below 100- LDL goal achieved. Doing well on the statin  -     Lipid panel; Future -     Rosuvastatin Calcium; Take 1 tablet (20 mg total) by mouth daily.  Dispense: 90 tablet; Refill: 1  Gastroesophageal reflux disease without esophagitis -      Esomeprazole Magnesium; Take 1 capsule (40 mg total) by mouth daily.  Dispense: 90 capsule; Refill: 1     Follow-up: Return in about 4 months (around 09/29/2022).  Sanda Linger, MD

## 2022-05-30 NOTE — Patient Instructions (Signed)

## 2022-05-31 ENCOUNTER — Telehealth: Payer: Self-pay | Admitting: Internal Medicine

## 2022-05-31 NOTE — Telephone Encounter (Signed)
Prescription Request  05/31/2022  LOV: 05/30/2022  What is the name of the medication or equipment? esomeprazole (NEXIUM) 40 MG capsule  indapamide (LOZOL) 1.25 MG tablet  rosuvastatin (CRESTOR) 20 MG tablet   Have you contacted your pharmacy to request a refill? No   Which pharmacy would you like this sent to?  [id number] [place], [place] - [address] AT [facility] [address] [place] [address] Phone: [phone] Fax: [phone]    Patient notified that their request is being sent to the clinical staff for review and that they should receive a response within 2 business days.   Please advise at Mobile [phone] (mobile)

## 2022-06-01 MED ORDER — ROSUVASTATIN CALCIUM 20 MG PO TABS: 20.0000 mg | ORAL_TABLET | Freq: Every day | ORAL | 1 refills | Status: DC

## 2022-06-01 MED ORDER — ESOMEPRAZOLE MAGNESIUM 40 MG PO CPDR: 40.0000 mg | DELAYED_RELEASE_CAPSULE | Freq: Every day | ORAL | 1 refills | Status: DC

## 2022-06-01 MED ORDER — INDAPAMIDE 1.25 MG PO TABS: 1.2500 mg | ORAL_TABLET | Freq: Every day | ORAL | 1 refills | Status: DC

## 2022-06-03 DIAGNOSIS — E785 Hyperlipidemia, unspecified: Secondary | ICD-10-CM | POA: Insufficient documentation

## 2022-07-18 ENCOUNTER — Telehealth: Payer: Self-pay | Admitting: Internal Medicine

## 2022-07-18 ENCOUNTER — Other Ambulatory Visit: Payer: Self-pay | Admitting: Internal Medicine

## 2022-07-18 DIAGNOSIS — E039 Hypothyroidism, unspecified: Secondary | ICD-10-CM

## 2022-07-18 MED ORDER — LEVOTHYROXINE SODIUM 150 MCG PO TABS
150.0000 ug | ORAL_TABLET | Freq: Every day | ORAL | 0 refills | Status: DC
Start: 2022-07-18 — End: 2022-07-18

## 2022-07-18 MED ORDER — LEVOTHYROXINE SODIUM 150 MCG PO TABS: 150.0000 ug | ORAL_TABLET | Freq: Every day | ORAL | 0 refills | Status: DC

## 2022-07-18 NOTE — Telephone Encounter (Signed)
Prescription Request  07/18/2022  LOV: 05/30/2022  What is the name of the medication or equipment? levothyroxine (SYNTHROID) 150 MCG tablet   Have you contacted your pharmacy to request a refill? No   Which pharmacy would you like this sent to?  [id number] - [place], [place] - [address] [place] [address] Phone: [phone] Fax: [phone]    Patient notified that their request is being sent to the clinical staff for review and that they should receive a response within 2 business days.   Please advise at Mobile [phone] (mobile)    Patient requested 90 day supply.

## 2022-08-30 ENCOUNTER — Other Ambulatory Visit: Payer: Self-pay | Admitting: Internal Medicine

## 2022-08-30 ENCOUNTER — Telehealth: Payer: Self-pay | Admitting: Internal Medicine

## 2022-08-30 DIAGNOSIS — I10 Essential (primary) hypertension: Secondary | ICD-10-CM

## 2022-08-30 MED ORDER — IRBESARTAN 300 MG PO TABS
ORAL_TABLET | ORAL | 0 refills | Status: DC
Start: 2022-08-30 — End: 2022-10-27

## 2022-08-30 NOTE — Telephone Encounter (Signed)
Prescription Request  08/30/2022  LOV: 05/30/2022  What is the name of the medication or equipment? Irbesartan    Have you contacted your pharmacy to request a refill? Yes   Which pharmacy would you like this sent to?  [facility] on [address]. [place]  Patient notified that their request is being sent to the clinical staff for review and that they should receive a response within 2 business days.   Please advise at Mobile [phone] (mobile)

## 2022-09-15 ENCOUNTER — Encounter (INDEPENDENT_AMBULATORY_CARE_PROVIDER_SITE_OTHER): Payer: Self-pay

## 2022-09-28 ENCOUNTER — Encounter: Payer: Self-pay | Admitting: Family Medicine

## 2022-09-28 ENCOUNTER — Ambulatory Visit (INDEPENDENT_AMBULATORY_CARE_PROVIDER_SITE_OTHER): Payer: BC Managed Care – PPO

## 2022-09-28 ENCOUNTER — Ambulatory Visit: Payer: BC Managed Care – PPO | Admitting: Family Medicine

## 2022-09-28 VITALS — BP 140/86 | HR 95 | Temp 98.3°F | Ht 70.0 in | Wt 242.0 lb

## 2022-09-28 DIAGNOSIS — R051 Acute cough: Secondary | ICD-10-CM | POA: Diagnosis not present

## 2022-09-28 DIAGNOSIS — R509 Fever, unspecified: Secondary | ICD-10-CM

## 2022-09-28 DIAGNOSIS — J189 Pneumonia, unspecified organism: Secondary | ICD-10-CM

## 2022-09-28 DIAGNOSIS — R062 Wheezing: Secondary | ICD-10-CM | POA: Diagnosis not present

## 2022-09-28 DIAGNOSIS — R059 Cough, unspecified: Secondary | ICD-10-CM | POA: Diagnosis not present

## 2022-09-28 DIAGNOSIS — R0789 Other chest pain: Secondary | ICD-10-CM | POA: Diagnosis not present

## 2022-09-28 DIAGNOSIS — R918 Other nonspecific abnormal finding of lung field: Secondary | ICD-10-CM | POA: Diagnosis not present

## 2022-09-28 LAB — CBC WITH DIFFERENTIAL/PLATELET
Basophils Absolute: 0 10*3/uL (ref 0.0–0.1)
Basophils Relative: 0.2 % (ref 0.0–3.0)
Eosinophils Absolute: 0 10*3/uL (ref 0.0–0.7)
Eosinophils Relative: 0.6 % (ref 0.0–5.0)
HCT: 43.3 % (ref 39.0–52.0)
Hemoglobin: 14.5 g/dL (ref 13.0–17.0)
Lymphocytes Relative: 27.8 % (ref 12.0–46.0)
Lymphs Abs: 2.1 10*3/uL (ref 0.7–4.0)
MCHC: 33.6 g/dL (ref 30.0–36.0)
MCV: 90.1 fl (ref 78.0–100.0)
Monocytes Absolute: 0.9 10*3/uL (ref 0.1–1.0)
Monocytes Relative: 12.3 % — ABNORMAL HIGH (ref 3.0–12.0)
Neutro Abs: 4.4 10*3/uL (ref 1.4–7.7)
Neutrophils Relative %: 59.1 % (ref 43.0–77.0)
Platelets: 232 10*3/uL (ref 150.0–400.0)
RBC: 4.81 Mil/uL (ref 4.22–5.81)
RDW: 13.8 % (ref 11.5–15.5)
WBC: 7.5 10*3/uL (ref 4.0–10.5)

## 2022-09-28 LAB — POCT INFLUENZA A/B
Influenza A, POC: NEGATIVE
Influenza B, POC: NEGATIVE

## 2022-09-28 LAB — POC COVID19 BINAXNOW: SARS Coronavirus 2 Ag: NEGATIVE

## 2022-09-28 MED ORDER — DOXYCYCLINE HYCLATE 100 MG PO TABS: 100.0000 mg | ORAL_TABLET | Freq: Two times a day (BID) | ORAL | 0 refills | Status: DC

## 2022-09-28 MED ORDER — PREDNISONE 20 MG PO TABS
40.0000 mg | ORAL_TABLET | Freq: Every day | ORAL | 0 refills | Status: DC
Start: 2022-09-28 — End: 2022-10-26

## 2022-09-28 MED ORDER — ALBUTEROL SULFATE HFA 108 (90 BASE) MCG/ACT IN AERS
2.0000 | INHALATION_SPRAY | Freq: Four times a day (QID) | RESPIRATORY_TRACT | 0 refills | Status: DC | PRN
Start: 2022-09-28 — End: 2022-10-05

## 2022-09-28 NOTE — Patient Instructions (Addendum)
Please go downstairs for labs and a chest x-ray before you leave.  Continue taking ibuprofen/Motrin and Mucinex.  You can take 800 mg of Motrin every 8 hours with food for the next 2 to 3 days as needed.  Use the albuterol inhaler as needed for chest tightness, wheezing or shortness of breath.   Start the steroids by mouth with food and plenty of water if you have any worsening chest tightness, wheezing or shortness of breath.   Let us know if you have any new or worsening symptoms.

## 2022-09-28 NOTE — Progress Notes (Signed)
X ray shows probable pneumonia in right lower lobe. I will send in an antibiotic. Please follow up in 3-4 weeks. He will need a follow up XR.

## 2022-09-28 NOTE — Progress Notes (Signed)
Subjective:  Brian Mcgee is a 62 y.o. male who presents for a 4 day hx of fever, chills, fatigue, body aches, congestion, cough, shortness of breath and wheezing.  He just returned from a cruise 2 days ago.   Mucinex and Motrin help with symptoms  Denies dizziness, chest pain, abdominal pain, N/V/D.   No other aggravating or relieving factors.  No other c/o.  ROS as in subjective.   Objective: Vitals:   09/28/22 1439  BP: (!) 140/86  Pulse: 95  Temp: 98.3 F (36.8 C)  SpO2: 95%    General appearance: Alert, WD/WN, no distress, mildly ill appearing                             Skin: warm, no rash                           Head: no sinus tenderness                            Eyes: conjunctiva normal, corneas clear, PERRLA                            Ears: pearly TMs, external ear canals normal                          Nose: septum midline, turbinates swollen, with erythema and clear discharge             Mouth/throat: MMM, tongue normal, mild pharyngeal erythema                           Neck: supple, no adenopathy, no thyromegaly, nontender                          Heart: RRR                         Lungs: + faint exp wheezes, no rales or rhonchi      Assessment: Community acquired pneumonia of right lung, unspecified part of lung - Plan: doxycycline (VIBRA-TABS) 100 MG tablet  Fever, unspecified fever cause - Plan: POCT Influenza A/B, POC COVID-19, DG Chest 2 View, CBC with Differential/Platelet, CBC with Differential/Platelet  Acute cough - Plan: POCT Influenza A/B, DG Chest 2 View, CBC with Differential/Platelet, albuterol (VENTOLIN HFA) 108 (90 Base) MCG/ACT inhaler, predniSONE (DELTASONE) 20 MG tablet, CBC with Differential/Platelet  Wheezing - Plan: DG Chest 2 View, CBC with Differential/Platelet, albuterol (VENTOLIN HFA) 108 (90 Base) MCG/ACT inhaler, predniSONE (DELTASONE) 20 MG tablet, CBC with Differential/Platelet   Plan: Negative Covid and rapid flu tests.  Recent foreign travel.  XR shows RLL pneumonia.  Doxycycline prescribed (PCN allergy) Oral prednisone and albuterol prescribed.  Continue Mucinex and Motrin.  He has a follow up with PCP next week.  Per radiologist, he should have repeat CXR in 3-4 weeks.

## 2022-10-05 ENCOUNTER — Ambulatory Visit: Payer: BC Managed Care – PPO | Admitting: Internal Medicine

## 2022-10-05 ENCOUNTER — Encounter: Payer: Self-pay | Admitting: Internal Medicine

## 2022-10-05 VITALS — BP 126/76 | HR 72 | Temp 98.1°F | Resp 16 | Ht 70.0 in | Wt 246.0 lb

## 2022-10-05 DIAGNOSIS — J189 Pneumonia, unspecified organism: Secondary | ICD-10-CM | POA: Diagnosis not present

## 2022-10-05 DIAGNOSIS — E039 Hypothyroidism, unspecified: Secondary | ICD-10-CM | POA: Diagnosis not present

## 2022-10-05 DIAGNOSIS — I10 Essential (primary) hypertension: Secondary | ICD-10-CM

## 2022-10-05 DIAGNOSIS — Z23 Encounter for immunization: Secondary | ICD-10-CM

## 2022-10-05 NOTE — Patient Instructions (Signed)
Community-Acquired Pneumonia, Adult Pneumonia is a lung infection that causes inflammation and the buildup of mucus and fluids in the lungs. This may cause coughing and difficulty breathing. Community-acquired pneumonia is pneumonia that develops in people who are not, and have not recently been, in a hospital or other health care facility. Usually, pneumonia develops as a result of an illness that is caused by a virus, such as the common cold and the flu (influenza). It can also be caused by bacteria or fungi. While the common cold and influenza can pass from person to person (are contagious), pneumonia itself is not considered contagious. What are the causes? This condition may be caused by: Viruses. Bacteria. Fungi. What increases the risk? The following factors may make you more likely to develop this condition: Being over age 65 or having certain medical conditions, such as: A long-term (chronic) disease, such as: chronic obstructive pulmonary disease (COPD), asthma, heart failure, diabetes, or kidney disease. A condition that increases the risk of breathing in (aspirating) mucus and other fluids from your mouth and nose. A weakened body defense system (immune system). Having had your spleen removed (splenectomy). The spleen is the organ that helps fight germs and infections. Not cleaning your teeth and gums well (poor dental hygiene). Using tobacco products. Traveling to places where germs that cause pneumonia are present or being near certain animals or animal habitats that could have germs that cause pneumonia. What are the signs or symptoms? Symptoms of this condition include: A dry cough or a wet (productive) cough. A fever, sweating, or chills. Chest pain, especially when breathing deeply or coughing. Fast breathing, difficulty breathing, or shortness of breath. Tiredness (fatigue) and muscle aches. How is this diagnosed? This condition may be diagnosed based on your medical  history or a physical exam. You may also have tests, including: Imaging, such as a chest X-ray or lung ultrasound. Tests of: The level of oxygen and other gases in your blood. Mucus from your lungs (sputum). Fluid around your lungs (pleural fluid). Your urine. How is this treated? Treatment for this condition depends on many factors, such as the cause of your pneumonia, your medicines, and other medical conditions that you have. For most adults, pneumonia may be treated at home. In some cases, treatment must happen in a hospital and may include: Medicines that are given by mouth (orally) or through an IV, including: Antibiotic medicines, if bacteria caused the pneumonia. Medicines that kill viruses (antiviral medicines), if a virus caused the pneumonia. Oxygen therapy. Severe pneumonia, although rare, may require the following treatments: Mechanical ventilation.This procedure uses a machine to help you breathe if you cannot breathe well on your own or maintain a safe level of blood oxygen. Thoracentesis. This procedure removes any buildup of pleural fluid to help with breathing. Follow these instructions at home:  Medicines Take over-the-counter and prescription medicines only as told by your health care provider. Take cough medicine only if you have trouble sleeping. Cough medicine can prevent your body from removing mucus from your lungs. If you were prescribed antibiotics, take them as told by your health care provider. Do not stop taking the antibiotic even if you start to feel better. Lifestyle     Do not drink alcohol. Do not use any products that contain nicotine or tobacco. These products include cigarettes, chewing tobacco, and vaping devices, such as e-cigarettes. If you need help quitting, ask your health care provider. Eat a healthy diet. This includes plenty of vegetables, fruits, whole grains, low-fat   dairy products, and lean protein. General instructions Rest a lot and  get at least 8 hours of sleep each night. Sleep in a partly upright position at night. Place a few pillows under your head or sleep in a reclining chair. Return to your normal activities as told by your health care provider. Ask your health care provider what activities are safe for you. Drink enough fluid to keep your urine pale yellow. This helps to thin the mucus in your lungs. If your throat is sore, gargle with a mixture of salt and water 3-4 times a day or as needed. To make salt water, completely dissolve -1 tsp (3-6 g) of salt in 1 cup (237 mL) of warm water. Keep all follow-up visits. How is this prevented? You can lower your risk of developing community-acquired pneumonia by: Getting the pneumonia vaccine. There are different types and schedules of pneumonia vaccines. Ask your health care provider which option is best for you. Consider getting the pneumonia vaccine if: You are older than 60 years of age. You are 19-65 years of age and are receiving cancer treatment, have chronic lung disease, or have other medical conditions that affect your immune system. Ask your health care provider if this applies to you. Getting your influenza vaccine every year. Ask your health care provider which type of vaccine is best for you. Getting regular dental checkups. Washing your hands often with soap and water for at least 20 seconds. If soap and water are not available, use hand sanitizer. Contact a health care provider if: You have a fever. You have trouble sleeping because you cannot control your cough with cough medicine. Get help right away if: Your shortness of breath becomes worse. Your chest pain increases. Your sickness becomes worse, especially if you are an older adult or have a weak immune system. You cough up blood. These symptoms may be an emergency. Get help right away. Call 911. Do not wait to see if the symptoms will go away. Do not drive yourself to the  hospital. Summary Pneumonia is an infection of the lungs. Community-acquired pneumonia develops in people who have not been in the hospital. It can be caused by bacteria, viruses, or fungi. This condition may be treated with antibiotics or antiviral medicines. Severe pneumonia may require a hospital stay and treatment to help with breathing. This information is not intended to replace advice given to you by your health care provider. Make sure you discuss any questions you have with your health care provider. Document Revised: 03/17/2021 Document Reviewed: 03/17/2021 [employer]vier Patient Education  2024 [employer]  

## 2022-10-05 NOTE — Progress Notes (Signed)
Subjective:  Patient ID: Brian Mcgee, male    DOB: 09-16-1960  Age: 62 y.o. MRN: [id number]  CC: Follow-up and Hypertension   HPI Brian Mcgee presents for f/up ---  Discussed the use of AI scribe software for clinical note transcription with the patient, who gave verbal consent to proceed.  History of Present Illness   The patient, with a history of thyroid disease, recently returned from a trip to [place], [place], [place], and [place]. They began feeling unwell towards the end of the trip, with symptoms worsening upon return. They experienced severe chest congestion, likened to 'an elephant sitting on my chest,' and a fever of 100.5. They sought medical attention due to the severity of symptoms, suspecting pneumonia.  The patient reported productive cough with greenish-yellow sputum, which has since improved. They have not had to expectorate into a cup at night for the past three to four nights. They deny any chest pain, fevers, chills, or night sweats currently. They estimate they are 90% improved, with residual symptoms of a scratchy throat and mild congestion.  They have been on a course of doxycycline and steroids, with three days of antibiotics remaining. They have not used the prescribed albuterol inhaler.   Regarding their thyroid disease, they feel their current dosage is adequate.       Outpatient Medications Prior to Visit  Medication Sig Dispense Refill   aspirin 81 MG tablet Take 81 mg by mouth daily.     doxycycline (VIBRA-TABS) 100 MG tablet Take 1 tablet (100 mg total) by mouth 2 (two) times daily. 20 tablet 0   esomeprazole (NEXIUM) 40 MG capsule Take 1 capsule (40 mg total) by mouth daily. 90 capsule 1   indapamide (LOZOL) 1.25 MG tablet Take 1 tablet (1.25 mg total) by mouth daily. 90 tablet 1   irbesartan (AVAPRO) 300 MG tablet TAKE ONE TABLET BY MOUTH DAILY 90 tablet 0   levothyroxine (SYNTHROID) 150 MCG tablet Take 1 tablet (150 mcg total) by mouth  daily before breakfast. 90 tablet 0   OVER THE COUNTER MEDICATION CPAP therapy     predniSONE (DELTASONE) 20 MG tablet Take 2 tablets (40 mg total) by mouth daily with breakfast. 10 tablet 0   rosuvastatin (CRESTOR) 20 MG tablet Take 1 tablet (20 mg total) by mouth daily. 90 tablet 1   sildenafil (REVATIO) 20 MG tablet Take 1 tablet (20 mg total) by mouth daily as needed. Take 1-3 tablets daily as needed 30 tablet 2   albuterol (VENTOLIN HFA) 108 (90 Base) MCG/ACT inhaler Inhale 2 puffs into the lungs every 6 (six) hours as needed for wheezing or shortness of breath. 8 g 0   No facility-administered medications prior to visit.    ROS Review of Systems  Constitutional:  Negative for chills, diaphoresis, fatigue and fever.  HENT: Negative.    Eyes: Negative.   Respiratory:  Positive for cough. Negative for chest tightness, shortness of breath, wheezing and stridor.   Cardiovascular:  Negative for chest pain, palpitations and leg swelling.  Gastrointestinal: Negative.  Negative for abdominal pain, diarrhea, nausea and vomiting.  Endocrine: Negative.   Genitourinary: Negative.  Negative for difficulty urinating.  Musculoskeletal: Negative.   Skin: Negative.   Neurological: Negative.  Negative for dizziness and light-headedness.  Hematological:  Negative for adenopathy. Does not bruise/bleed easily.  Psychiatric/Behavioral: Negative.      Objective:  BP 126/76 (BP Location: Right Arm, Patient Position: Sitting, Cuff Size: Large)   Pulse 72  Temp 98.1 F (36.7 C) (Oral)   Resp 16   Ht 5\' 10"  (1.778 m)   Wt 246 lb (111.6 kg)   SpO2 94%   BMI 35.30 kg/m   BP Readings from Last 3 Encounters:  10/05/22 126/76  09/28/22 (!) 140/86  05/30/22 134/86    Wt Readings from Last 3 Encounters:  10/05/22 246 lb (111.6 kg)  09/28/22 242 lb (109.8 kg)  05/30/22 248 lb (112.5 kg)    Physical Exam Vitals reviewed.  Constitutional:      Appearance: Normal appearance.  HENT:      Mouth/Throat:     Mouth: Mucous membranes are moist.  Eyes:     General: No scleral icterus.    Conjunctiva/sclera: Conjunctivae normal.  Cardiovascular:     Rate and Rhythm: Normal rate and regular rhythm.     Heart sounds: No murmur heard. Pulmonary:     Effort: Pulmonary effort is normal.     Breath sounds: No stridor. No wheezing, rhonchi or rales.  Abdominal:     General: Abdomen is flat.     Palpations: There is no mass.     Tenderness: There is no abdominal tenderness. There is no guarding.     Hernia: No hernia is present.  Musculoskeletal:        General: Normal range of motion.     Cervical back: Neck supple.     Right lower leg: No edema.     Left lower leg: No edema.  Lymphadenopathy:     Cervical: No cervical adenopathy.  Skin:    General: Skin is warm and dry.     Findings: No rash.  Neurological:     General: No focal deficit present.     Mental Status: He is alert. Mental status is at baseline.  Psychiatric:        Mood and Affect: Mood normal.        Behavior: Behavior normal.     Lab Results  Component Value Date   WBC 7.5 09/28/2022   HGB 14.5 09/28/2022   HCT 43.3 09/28/2022   PLT 232.0 09/28/2022   GLUCOSE 104 (H) 03/09/2022   CHOL 117 05/30/2022   TRIG 111.0 05/30/2022   HDL 37.70 (L) 05/30/2022   LDLCALC 57 05/30/2022   ALT 17 03/09/2022   AST 15 03/09/2022   NA 140 03/09/2022   K 3.9 03/09/2022   CL 104 03/09/2022   CREATININE 1.08 03/09/2022   BUN 15 03/09/2022   CO2 29 03/09/2022   TSH 1.81 03/09/2022   PSA 2.11 09/06/2021   HGBA1C 5.6 03/09/2022   DG Chest 2 View  Result Date: 09/28/2022 CLINICAL DATA:  Cough, fever and chest tightness. Recent foreign travel. EXAM: CHEST - 2 VIEW COMPARISON:  None Available. FINDINGS: Patchy opacities in the right lower lung are concerning for airspace disease and infection. Upper lungs are clear. Retrocardiac opacity is suggestive for a hiatal hernia. Trachea is midline. Heart size is within  normal limits. No large pleural effusions. IMPRESSION: 1. Patchy opacities in the right lower lung are concerning for airspace disease and infection. Followup PA and lateral chest X-ray is recommended in 3-4 weeks following trial of antibiotic therapy to ensure resolution and exclude underlying malignancy. 2. Hiatal hernia. Electronically Signed   By: Richarda Overlie M.D.   On: 09/28/2022 15:42     Assessment & Plan:   Community acquired pneumonia of right lung, unspecified part of lung- Improvement noted.  He will complete the course of  doxycycline and I will recheck his chest x-ray in about 3 weeks.  Hypothyroidism, unspecified type- He is euthyroid.  Essential hypertension- His blood pressure is adequately well-controlled.  Flu vaccine need -     Flu vaccine trivalent PF, 6mos and older(Flulaval,Afluria,Fluarix,Fluzone)     Follow-up: Return in about 3 weeks (around 10/26/2022).  Sanda Linger, MD

## 2022-10-24 ENCOUNTER — Other Ambulatory Visit: Payer: Self-pay | Admitting: Internal Medicine

## 2022-10-24 DIAGNOSIS — E039 Hypothyroidism, unspecified: Secondary | ICD-10-CM

## 2022-10-26 ENCOUNTER — Ambulatory Visit: Payer: BC Managed Care – PPO | Admitting: Internal Medicine

## 2022-10-26 ENCOUNTER — Encounter: Payer: Self-pay | Admitting: Internal Medicine

## 2022-10-26 VITALS — BP 142/88 | HR 106 | Temp 98.2°F | Resp 16 | Ht 70.0 in | Wt 251.0 lb

## 2022-10-26 DIAGNOSIS — Z0000 Encounter for general adult medical examination without abnormal findings: Secondary | ICD-10-CM | POA: Diagnosis not present

## 2022-10-26 DIAGNOSIS — E039 Hypothyroidism, unspecified: Secondary | ICD-10-CM | POA: Insufficient documentation

## 2022-10-26 DIAGNOSIS — R000 Tachycardia, unspecified: Secondary | ICD-10-CM

## 2022-10-26 DIAGNOSIS — G4733 Obstructive sleep apnea (adult) (pediatric): Secondary | ICD-10-CM | POA: Diagnosis not present

## 2022-10-26 DIAGNOSIS — Z0001 Encounter for general adult medical examination with abnormal findings: Secondary | ICD-10-CM | POA: Insufficient documentation

## 2022-10-26 DIAGNOSIS — I10 Essential (primary) hypertension: Secondary | ICD-10-CM

## 2022-10-26 LAB — BASIC METABOLIC PANEL
BUN: 16 mg/dL (ref 6–23)
CO2: 30 mEq/L (ref 19–32)
Calcium: 9.1 mg/dL (ref 8.4–10.5)
Chloride: 102 mEq/L (ref 96–112)
Creatinine, Ser: 1.24 mg/dL (ref 0.40–1.50)
GFR: 63.13 mL/min (ref >60.00)
Glucose, Bld: 90 mg/dL (ref 70–99)
Potassium: 4.1 mEq/L (ref 3.5–5.1)
Sodium: 140 mEq/L (ref 135–145)

## 2022-10-26 LAB — PSA: PSA: 2.91 ng/mL (ref 0.10–4.00)

## 2022-10-26 LAB — TSH: TSH: 3.66 u[IU]/mL (ref 0.35–5.50)

## 2022-10-26 NOTE — Patient Instructions (Signed)
Health Maintenance, Male Adopting a healthy lifestyle and getting preventive care are important in promoting health and wellness. Ask your health care provider about: The right schedule for you to have regular tests and exams. Things you can do on your own to prevent diseases and keep yourself healthy. What should I know about diet, weight, and exercise? Eat a healthy diet  Eat a diet that includes plenty of vegetables, fruits, low-fat dairy products, and lean protein. Do not eat a lot of foods that are high in solid fats, added sugars, or sodium. Maintain a healthy weight Body mass index (BMI) is a measurement that can be used to identify possible weight problems. It estimates body fat based on height and weight. Your health care provider can help determine your BMI and help you achieve or maintain a healthy weight. Get regular exercise Get regular exercise. This is one of the most important things you can do for your health. Most adults should: Exercise for at least 150 minutes each week. The exercise should increase your heart rate and make you sweat (moderate-intensity exercise). Do strengthening exercises at least twice a week. This is in addition to the moderate-intensity exercise. Spend less time sitting. Even light physical activity can be beneficial. Watch cholesterol and blood lipids Have your blood tested for lipids and cholesterol at 60 years of age, then have this test every 5 years. You may need to have your cholesterol levels checked more often if: Your lipid or cholesterol levels are high. You are older than 60 years of age. You are at high risk for heart disease. What should I know about cancer screening? Many types of cancers can be detected early and may often be prevented. Depending on your health history and family history, you may need to have cancer screening at various ages. This may include screening for: Colorectal cancer. Prostate cancer. Skin cancer. Lung  cancer. What should I know about heart disease, diabetes, and high blood pressure? Blood pressure and heart disease High blood pressure causes heart disease and increases the risk of stroke. This is more likely to develop in people who have high blood pressure readings or are overweight. Talk with your health care provider about your target blood pressure readings. Have your blood pressure checked: Every 3-5 years if you are 18-39 years of age. Every year if you are 40 years old or older. If you are between the ages of 65 and 75 and are a current or former smoker, ask your health care provider if you should have a one-time screening for abdominal aortic aneurysm (AAA). Diabetes Have regular diabetes screenings. This checks your fasting blood sugar level. Have the screening done: Once every three years after age 45 if you are at a normal weight and have a low risk for diabetes. More often and at a younger age if you are overweight or have a high risk for diabetes. What should I know about preventing infection? Hepatitis B If you have a higher risk for hepatitis B, you should be screened for this virus. Talk with your health care provider to find out if you are at risk for hepatitis B infection. Hepatitis C Blood testing is recommended for: Everyone born from 1945 through 1965. Anyone with known risk factors for hepatitis C. Sexually transmitted infections (STIs) You should be screened each year for STIs, including gonorrhea and chlamydia, if: You are sexually active and are younger than 60 years of age. You are older than 60 years of age and your   health care provider tells you that you are at risk for this type of infection. Your sexual activity has changed since you were last screened, and you are at increased risk for chlamydia or gonorrhea. Ask your health care provider if you are at risk. Ask your health care provider about whether you are at high risk for HIV. Your health care provider  may recommend a prescription medicine to help prevent HIV infection. If you choose to take medicine to prevent HIV, you should first get tested for HIV. You should then be tested every 3 months for as long as you are taking the medicine. Follow these instructions at home: Alcohol use Do not drink alcohol if your health care provider tells you not to drink. If you drink alcohol: Limit how much you have to 0-2 drinks a day. Know how much alcohol is in your drink. In the [place] one drink equals one 12 oz bottle of beer (355 mL), one 5 oz glass of wine (148 mL), or one 1 oz glass of hard liquor (44 mL). Lifestyle Do not use any products that contain nicotine or tobacco. These products include cigarettes, chewing tobacco, and vaping devices, such as e-cigarettes. If you need help quitting, ask your health care provider. Do not use street drugs. Do not share needles. Ask your health care provider for help if you need support or information about quitting drugs. General instructions Schedule regular health, dental, and eye exams. Stay current with your vaccines. Tell your health care provider if: You often feel depressed. You have ever been abused or do not feel safe at home. Summary Adopting a healthy lifestyle and getting preventive care are important in promoting health and wellness. Follow your health care provider's instructions about healthy diet, exercising, and getting tested or screened for diseases. Follow your health care provider's instructions on monitoring your cholesterol and blood pressure. This information is not intended to replace advice given to you by your health care provider. Make sure you discuss any questions you have with your health care provider. Document Revised: 06/08/2020 Document Reviewed: 06/08/2020 Elsevier Patient Education  2024 [employer]vier Inc.  

## 2022-10-26 NOTE — Progress Notes (Unsigned)
Subjective:  Patient ID: Brian Mcgee, male    DOB: [birth year]  Age: 62 y.o. MRN: [id number]  CC: Hypothyroidism, Annual Exam, and Hypertension   HPI Brian Mcgee presents for a CPX and f/up ---  Discussed the use of AI scribe software for clinical note transcription with the patient, who gave verbal consent to proceed.  History of Present Illness   The patient, with a history of pneumonia, thyroid disease, and sleep apnea, presents for a routine physical. He reports feeling well overall, with no residual symptoms from his recent bout of pneumonia, including coughing, wheezing, or shortness of breath. He denies any current use of antibiotics or steroids.  However, he has been experiencing fatigue, particularly in the late afternoon, which he attributes to poor dietary habits and being overweight. He also reports running out of his thyroid medication today. He denies any symptoms of dizziness or lightheadedness, chest pain, or shortness of breath. He maintains an active lifestyle, able to walk three miles without any discomfort.  The patient also reports issues with his CPAP machine, which he has not been using due to a recall and subsequent replacement. He expresses uncertainty about the correct settings on the new machine. He has not been experiencing any abdominal discomfort or issues with his bowel movements.        Outpatient Medications Prior to Visit  Medication Sig Dispense Refill  . aspirin 81 MG tablet Take 81 mg by mouth daily.    Marland Kitchen esomeprazole (NEXIUM) 40 MG capsule Take 1 capsule (40 mg total) by mouth daily. 90 capsule 1  . OVER THE COUNTER MEDICATION CPAP therapy    . rosuvastatin (CRESTOR) 20 MG tablet Take 1 tablet (20 mg total) by mouth daily. 90 tablet 1  . sildenafil (REVATIO) 20 MG tablet Take 1 tablet (20 mg total) by mouth daily as needed. Take 1-3 tablets daily as needed 30 tablet 2  . doxycycline (VIBRA-TABS) 100 MG tablet Take 1 tablet (100 mg total) by  mouth 2 (two) times daily. 20 tablet 0  . indapamide (LOZOL) 1.25 MG tablet Take 1 tablet (1.25 mg total) by mouth daily. 90 tablet 1  . irbesartan (AVAPRO) 300 MG tablet TAKE ONE TABLET BY MOUTH DAILY 90 tablet 0  . levothyroxine (SYNTHROID) 150 MCG tablet Take 1 tablet (150 mcg total) by mouth daily before breakfast. 90 tablet 0  . predniSONE (DELTASONE) 20 MG tablet Take 2 tablets (40 mg total) by mouth daily with breakfast. 10 tablet 0   No facility-administered medications prior to visit.    ROS Review of Systems  Constitutional:  Positive for fatigue and unexpected weight change (wt gain). Negative for chills and fever.  Eyes:  Negative for visual disturbance.  Respiratory:  Positive for apnea. Negative for cough, chest tightness, shortness of breath and wheezing.   Cardiovascular:  Negative for chest pain, palpitations and leg swelling.  Gastrointestinal: Negative.  Negative for abdominal pain, constipation, diarrhea, nausea and vomiting.  Endocrine: Negative.   Genitourinary: Negative.  Negative for difficulty urinating.  Musculoskeletal: Negative.  Negative for arthralgias and myalgias.  Skin: Negative.  Negative for color change and pallor.  Neurological: Negative.  Negative for dizziness and weakness.  Hematological:  Negative for adenopathy. Does not bruise/bleed easily.  Psychiatric/Behavioral: Negative.      Objective:  BP (!) 142/88 (BP Location: Left Arm, Patient Position: Sitting, Cuff Size: Large)   Pulse (!) 106   Temp 98.2 F (36.8 C) (Oral)   Resp 16  Ht 5\' 10"  (1.778 m)   Wt 251 lb (113.9 kg)   SpO2 96%   BMI 36.01 kg/m   BP Readings from Last 3 Encounters:  10/26/22 (!) 142/88  10/05/22 126/76  09/28/22 (!) 140/86    Wt Readings from Last 3 Encounters:  10/26/22 251 lb (113.9 kg)  10/05/22 246 lb (111.6 kg)  09/28/22 242 lb (109.8 kg)    Physical Exam Vitals reviewed.  Constitutional:      Appearance: Normal appearance.  HENT:      Mouth/Throat:     Mouth: Mucous membranes are moist.  Eyes:     General: No scleral icterus.    Conjunctiva/sclera: Conjunctivae normal.  Cardiovascular:     Rate and Rhythm: Regular rhythm. Tachycardia present.     Heart sounds: Normal heart sounds. No murmur heard.    No gallop.     Comments: EKG- ST, 100 bpm Minimal LVH No Q waves or ST/T wave changes Pulmonary:     Effort: Pulmonary effort is normal.     Breath sounds: No stridor. No wheezing, rhonchi or rales.  Abdominal:     General: Abdomen is protuberant. Bowel sounds are normal. There is no distension or abdominal bruit.     Palpations: Abdomen is soft. There is no hepatomegaly, splenomegaly or mass.     Tenderness: There is no abdominal tenderness. There is no guarding or rebound.     Hernia: No hernia is present. There is no hernia in the left inguinal area or right inguinal area.  Genitourinary:    Pubic Area: No rash.      Penis: Normal and circumcised.      Testes: Normal.     Epididymis:     Right: Normal.     Left: Normal.     Prostate: Enlarged. Not tender and no nodules present.     Rectum: Guaiac result negative. Internal hemorrhoid present. No mass, tenderness, anal fissure or external hemorrhoid. Normal anal tone.  Musculoskeletal:        General: Normal range of motion.     Cervical back: Neck supple.     Right lower leg: No edema.     Left lower leg: No edema.  Lymphadenopathy:     Cervical: No cervical adenopathy.     Lower Body: No right inguinal adenopathy. No left inguinal adenopathy.  Skin:    General: Skin is warm and dry.  Neurological:     General: No focal deficit present.     Mental Status: He is alert. Mental status is at baseline.  Psychiatric:        Mood and Affect: Mood normal.        Behavior: Behavior normal.        Thought Content: Thought content normal.        Judgment: Judgment normal.    Lab Results  Component Value Date   WBC 7.5 09/28/2022   HGB 14.5 09/28/2022   HCT  43.3 09/28/2022   PLT 232.0 09/28/2022   GLUCOSE 90 10/26/2022   CHOL 117 05/30/2022   TRIG 111.0 05/30/2022   HDL 37.70 (L) 05/30/2022   LDLCALC 57 05/30/2022   ALT 17 03/09/2022   AST 15 03/09/2022   NA 140 10/26/2022   K 4.1 10/26/2022   CL 102 10/26/2022   CREATININE 1.24 10/26/2022   BUN 16 10/26/2022   CO2 30 10/26/2022   TSH 3.66 10/26/2022   PSA 2.91 10/26/2022   HGBA1C 5.6 03/09/2022    No results  found.  Assessment & Plan:   Hypothyroidism, unspecified type - He is euthyroid. -     TSH; Future -     Basic metabolic panel; Future -     Levothyroxine Sodium; Take 1 tablet (150 mcg total) by mouth daily before breakfast.  Dispense: 90 tablet; Refill: 1  Essential hypertension - BP is not at goal and HR is elevated. Will add a BB. -     TSH; Future -     Basic metabolic panel; Future -     Urinalysis, Routine w reflex microscopic; Future -     EKG 12-Lead -     Irbesartan; Take 1 tablet (300 mg total) by mouth daily. TAKE ONE TABLET BY MOUTH DAILY  Dispense: 90 tablet; Refill: 1 -     Nebivolol HCl; Take 1 tablet (5 mg total) by mouth daily.  Dispense: 90 tablet; Refill: 1 -     Indapamide; Take 1 tablet (1.25 mg total) by mouth daily.  Dispense: 90 tablet; Refill: 1  Encounter for general adult medical examination with abnormal findings- Exam completed, labs reviewed, vaccines reviewed, cancer screenings addressed, pt ed material was given.  -     PSA; Future  OSA (obstructive sleep apnea) -     PSG SLEEP STUDY  Tachycardia -     Nebivolol HCl; Take 1 tablet (5 mg total) by mouth daily.  Dispense: 90 tablet; Refill: 1     Follow-up: Return in about 6 months (around 04/25/2023).  Sanda Linger, MD

## 2022-10-27 LAB — URINALYSIS, ROUTINE W REFLEX MICROSCOPIC
Bilirubin Urine: NEGATIVE
Hgb urine dipstick: NEGATIVE
Leukocytes,Ua: NEGATIVE
Nitrite: NEGATIVE
RBC / HPF: NONE SEEN (ref 0–?)
Specific Gravity, Urine: 1.025 (ref 1.000–1.030)
Total Protein, Urine: NEGATIVE
Urine Glucose: NEGATIVE
Urobilinogen, UA: 0.2 (ref 0.0–1.0)
pH: 6 (ref 5.0–8.0)

## 2022-10-27 MED ORDER — LEVOTHYROXINE SODIUM 150 MCG PO TABS
150.0000 ug | ORAL_TABLET | Freq: Every day | ORAL | 1 refills | Status: DC
Start: 2022-10-27 — End: 2023-04-19

## 2022-10-27 MED ORDER — INDAPAMIDE 1.25 MG PO TABS
1.2500 mg | ORAL_TABLET | Freq: Every day | ORAL | 1 refills | Status: DC
Start: 2022-10-27 — End: 2022-12-06

## 2022-10-27 MED ORDER — IRBESARTAN 300 MG PO TABS
300.0000 mg | ORAL_TABLET | Freq: Every day | ORAL | 1 refills | Status: DC
Start: 2022-10-27 — End: 2023-06-01

## 2022-10-27 MED ORDER — NEBIVOLOL HCL 5 MG PO TABS
5.0000 mg | ORAL_TABLET | Freq: Every day | ORAL | 1 refills | Status: DC
Start: 2022-10-27 — End: 2023-03-27

## 2022-12-05 DIAGNOSIS — D170 Benign lipomatous neoplasm of skin and subcutaneous tissue of head, face and neck: Secondary | ICD-10-CM | POA: Diagnosis not present

## 2022-12-05 DIAGNOSIS — Z85828 Personal history of other malignant neoplasm of skin: Secondary | ICD-10-CM | POA: Diagnosis not present

## 2022-12-05 DIAGNOSIS — L814 Other melanin hyperpigmentation: Secondary | ICD-10-CM | POA: Diagnosis not present

## 2022-12-05 DIAGNOSIS — L570 Actinic keratosis: Secondary | ICD-10-CM | POA: Diagnosis not present

## 2022-12-05 DIAGNOSIS — D1724 Benign lipomatous neoplasm of skin and subcutaneous tissue of left leg: Secondary | ICD-10-CM | POA: Diagnosis not present

## 2022-12-06 ENCOUNTER — Other Ambulatory Visit: Payer: Self-pay

## 2022-12-06 ENCOUNTER — Telehealth: Payer: Self-pay | Admitting: Internal Medicine

## 2022-12-06 DIAGNOSIS — K219 Gastro-esophageal reflux disease without esophagitis: Secondary | ICD-10-CM

## 2022-12-06 DIAGNOSIS — E785 Hyperlipidemia, unspecified: Secondary | ICD-10-CM

## 2022-12-06 DIAGNOSIS — I10 Essential (primary) hypertension: Secondary | ICD-10-CM

## 2022-12-06 MED ORDER — ROSUVASTATIN CALCIUM 20 MG PO TABS: 20.0000 mg | ORAL_TABLET | Freq: Every day | ORAL | 1 refills | Status: DC

## 2022-12-06 MED ORDER — INDAPAMIDE 1.25 MG PO TABS: 1.2500 mg | ORAL_TABLET | Freq: Every day | ORAL | 1 refills | Status: DC

## 2022-12-06 MED ORDER — ESOMEPRAZOLE MAGNESIUM 40 MG PO CPDR: 40.0000 mg | DELAYED_RELEASE_CAPSULE | Freq: Every day | ORAL | 1 refills | Status: DC

## 2022-12-06 NOTE — Telephone Encounter (Signed)
Prescription Request  12/06/2022  LOV: 10/26/2022  What is the name of the medication or equipment?esomeprazole, indapamide, rosuvastatin - patient wants 90 each  Have you contacted your pharmacy to request a refill? Yes   Which pharmacy would you like this sent to?  [id number] - [place], [place] - [address] [place] [place] [address] Phone: [phone] Fax: [phone]    Patient notified that their request is being sent to the clinical staff for review and that they should receive a response within 2 business days.   Please advise at Mobile [phone] (mobile)

## 2022-12-06 NOTE — Telephone Encounter (Signed)
Refill sent to Dr. Yetta Barre

## 2023-03-27 ENCOUNTER — Encounter: Payer: Self-pay | Admitting: Internal Medicine

## 2023-03-27 ENCOUNTER — Telehealth: Payer: Self-pay

## 2023-03-27 ENCOUNTER — Other Ambulatory Visit (INDEPENDENT_AMBULATORY_CARE_PROVIDER_SITE_OTHER): Payer: BC Managed Care – PPO

## 2023-03-27 ENCOUNTER — Ambulatory Visit: Payer: BC Managed Care – PPO | Admitting: Internal Medicine

## 2023-03-27 VITALS — BP 132/86 | HR 74 | Temp 97.5°F | Resp 16 | Ht 70.0 in | Wt 256.4 lb

## 2023-03-27 DIAGNOSIS — E66812 Obesity, class 2: Secondary | ICD-10-CM | POA: Diagnosis not present

## 2023-03-27 DIAGNOSIS — E785 Hyperlipidemia, unspecified: Secondary | ICD-10-CM | POA: Diagnosis not present

## 2023-03-27 DIAGNOSIS — I10 Essential (primary) hypertension: Secondary | ICD-10-CM

## 2023-03-27 DIAGNOSIS — N401 Enlarged prostate with lower urinary tract symptoms: Secondary | ICD-10-CM | POA: Diagnosis not present

## 2023-03-27 DIAGNOSIS — E039 Hypothyroidism, unspecified: Secondary | ICD-10-CM | POA: Diagnosis not present

## 2023-03-27 DIAGNOSIS — R3911 Hesitancy of micturition: Secondary | ICD-10-CM

## 2023-03-27 DIAGNOSIS — Z6836 Body mass index (BMI) 36.0-36.9, adult: Secondary | ICD-10-CM

## 2023-03-27 LAB — LIPID PANEL
Cholesterol: 123 mg/dL (ref 0–200)
HDL: 36.9 mg/dL — ABNORMAL LOW (ref >39.00)
LDL Cholesterol: 46 mg/dL (ref 0–99)
NonHDL: 85.98
Total CHOL/HDL Ratio: 3
Triglycerides: 198 mg/dL — ABNORMAL HIGH (ref 0.0–149.0)
VLDL: 39.6 mg/dL (ref 0.0–40.0)

## 2023-03-27 LAB — BASIC METABOLIC PANEL
BUN: 18 mg/dL (ref 6–23)
CO2: 26 meq/L (ref 19–32)
Calcium: 9.2 mg/dL (ref 8.4–10.5)
Chloride: 103 meq/L (ref 96–112)
Creatinine, Ser: 1.21 mg/dL (ref 0.40–1.50)
GFR: 64.83 mL/min (ref >60.00)
Glucose, Bld: 116 mg/dL — ABNORMAL HIGH (ref 70–99)
Potassium: 4.2 meq/L (ref 3.5–5.1)
Sodium: 141 meq/L (ref 135–145)

## 2023-03-27 LAB — TSH: TSH: 2.7 u[IU]/mL (ref 0.35–5.50)

## 2023-03-27 LAB — HEPATIC FUNCTION PANEL
ALT: 16 U/L (ref 0–53)
AST: 16 U/L (ref 0–37)
Albumin: 4.4 g/dL (ref 3.5–5.2)
Alkaline Phosphatase: 44 U/L (ref 39–117)
Bilirubin, Direct: 0.1 mg/dL (ref 0.0–0.3)
Total Bilirubin: 0.6 mg/dL (ref 0.2–1.2)
Total Protein: 7.4 g/dL (ref 6.0–8.3)

## 2023-03-27 LAB — CBC WITH DIFFERENTIAL/PLATELET
Basophils Absolute: 0 10*3/uL (ref 0.0–0.1)
Basophils Relative: 0.6 % (ref 0.0–3.0)
Eosinophils Absolute: 0.2 10*3/uL (ref 0.0–0.7)
Eosinophils Relative: 4.1 % (ref 0.0–5.0)
HCT: 44.9 % (ref 39.0–52.0)
Hemoglobin: 15.4 g/dL (ref 13.0–17.0)
Lymphocytes Relative: 30.3 % (ref 12.0–46.0)
Lymphs Abs: 1.8 10*3/uL (ref 0.7–4.0)
MCHC: 34.2 g/dL (ref 30.0–36.0)
MCV: 92.6 fl (ref 78.0–100.0)
Monocytes Absolute: 0.6 10*3/uL (ref 0.1–1.0)
Monocytes Relative: 9.4 % (ref 3.0–12.0)
Neutro Abs: 3.3 10*3/uL (ref 1.4–7.7)
Neutrophils Relative %: 55.6 % (ref 43.0–77.0)
Platelets: 234 10*3/uL (ref 150.0–400.0)
RBC: 4.85 Mil/uL (ref 4.22–5.81)
RDW: 13.8 % (ref 11.5–15.5)
WBC: 5.9 10*3/uL (ref 4.0–10.5)

## 2023-03-27 LAB — URINALYSIS, ROUTINE W REFLEX MICROSCOPIC
Bilirubin Urine: NEGATIVE
Hgb urine dipstick: NEGATIVE
Ketones, ur: NEGATIVE
Leukocytes,Ua: NEGATIVE
Nitrite: NEGATIVE
RBC / HPF: NONE SEEN (ref 0–?)
Specific Gravity, Urine: 1.02 (ref 1.000–1.030)
Total Protein, Urine: NEGATIVE
Urine Glucose: NEGATIVE
Urobilinogen, UA: 0.2 (ref 0.0–1.0)
pH: 6 (ref 5.0–8.0)

## 2023-03-27 LAB — PSA: PSA: 2.14 ng/mL (ref 0.10–4.00)

## 2023-03-27 MED ORDER — TIRZEPATIDE 2.5 MG/0.5ML ~~LOC~~ SOAJ: 2.5000 mg | SUBCUTANEOUS | 0 refills | Status: DC

## 2023-03-27 NOTE — Patient Instructions (Signed)
 Tirzepatide Injection (Weight Management) What is this medication? TIRZEPATIDE (tir ZEP a tide) promotes weight loss. It may also be used to maintain weight loss.  It works by decreasing appetite. Changes to diet and exercise are often combined with this medication. This medicine may be used for other purposes; ask your health care provider or pharmacist if you have questions. COMMON BRAND NAME(S): Zepbound What should I tell my care team before I take this medication? They need to know if you have any of these conditions: Diabetes Eye disease caused by diabetes Gallbladder disease Have or have had depression Have or have had pancreatitis Having surgery Kidney disease Personal or family history of MEN 2, a condition that causes endocrine gland tumors Personal or family history of thyroid cancer Stomach or intestine problems, such as problems digesting food Suicidal thoughts, plans, or attempt An unusual or allergic reaction to tirzepatide, other medications, foods, dyes, or preservatives Pregnant or trying to get pregnant Breastfeeding How should I use this medication? This medication is injected under the skin. You will be taught how to prepare and give it. Take it as directed on the prescription label. Keep taking it unless your care team tells you to stop. It is important that you put your used needles and syringes in a special sharps container. Do not put them in a trash can. If you do not have a sharps container, call your pharmacist or care team to get one. A special MedGuide will be given to you by the pharmacist with each prescription and refill. Be sure to read this information carefully each time. This medication comes with INSTRUCTIONS FOR USE. Ask your pharmacist for directions on how to use this medication. Read the information carefully. Talk to your pharmacist or care team if you have questions. Talk to your care team about the use of this medication in children. Special care  may be needed. Overdosage: If you think you have taken too much of this medicine contact a poison control center or emergency room at once. NOTE: This medicine is only for you. Do not share this medicine with others. What if I miss a dose? If you miss a dose, take it as soon as you can unless it is more than 4 days (96 hours) late. If it is more than 4 days late, skip the missed dose. Take the next dose at the normal time. Do not take 2 doses within 3 days (72 hours) of each other. What may interact with this medication? Certain medications for diabetes, such as insulin, glyburide, glipizide This medication may affect how other medications work. Talk with your care team about all of the medications you take. They may suggest changes to your treatment plan to lower the risk of side effects and to make sure your medications work as intended. This list may not describe all possible interactions. Give your health care provider a list of all the medicines, herbs, non-prescription drugs, or dietary supplements you use. Also tell them if you smoke, drink alcohol, or use illegal drugs. Some items may interact with your medicine. What should I watch for while using this medication? Visit your care team for regular checks on your progress. Tell your care team if your condition does not start to get better or if it gets worse. Tell your care team if you are taking medication to treat diabetes, such as insulin or glipizide. This may increase your risk of low blood sugar. Know the symptoms of low blood sugar and how to  treat it. Talk to your care team about your risk of cancer. You may be more at risk for certain types of cancer if you take this medication. Talk to your care team right away if you have a lump or swelling in your neck, hoarseness that does not go away, trouble swallowing, shortness of breath, or trouble breathing. Make sure you stay hydrated while taking this medication. Drink water often. Eat fruits  and veggies that have a high water content. Drink more water when it is hot or you are active. Talk to your care team right away if you have fever, infection, vomiting, diarrhea, or if you sweat a lot while taking this medication. The loss of too much body fluid may make it dangerous for you to take this medication. If you are going to need surgery or a procedure, tell your care team that you are taking this medication. Estrogen and progestin hormones that you take by mouth may not work as well while you are taking this medication. Switch to a non-oral contraceptive or add a barrier contraceptive for 4 weeks after starting this medication and after each dose increase. Talk to your care team about contraceptive options. They can help you find the option that works for you. What side effects may I notice from receiving this medication? Side effects that you should report to your care team as soon as possible: Allergic reactions or angioedema--skin rash, itching or hives, swelling of the face, eyes, lips, tongue, arms, or legs, trouble swallowing or breathing Bowel blockage--stomach cramping, unable to have a bowel movement or pass gas, loss of appetite, vomiting Change in vision Dehydration--increased thirst, dry mouth, feeling faint or lightheaded, headache, dark yellow or brown urine Gallbladder problems--severe stomach pain, nausea, vomiting, fever Kidney injury--decrease in the amount of urine, swelling of the ankles, hands, or feet Pancreatitis--severe stomach pain that spreads to your back or gets worse after eating or when touched, fever, nausea, vomiting Thoughts of suicide or self-harm, worsening mood, feelings of depression Thyroid cancer--new mass or lump in the neck, pain or trouble swallowing, trouble breathing, hoarseness Side effects that usually do not require medical attention (report these to your care team if they continue or are bothersome): Diarrhea Loss of appetite Nausea Upset  stomach This list may not describe all possible side effects. Call your doctor for medical advice about side effects. You may report side effects to FDA at 1-800-FDA-1088. Where should I keep my medication? Keep out of the reach of children and pets. Store in a refrigerator or at room temperature up to 30 degrees C (86 degrees F). Keep it in the original container. Protect from light. Refrigeration (preferred): Store in the refrigerator. Do not freeze. Get rid of any unused medication after the expiration date. Room temperature: This medication may be stored at room temperature for up to 21 days. If it is stored at room temperature, get rid of any unused medication after 21 days or after it expires, whichever is first. To get rid of medications that are no longer needed or have expired: Take the medication to a medication take-back program. Check with your pharmacy or law enforcement to find a location. If you cannot return the medication, ask your pharmacist or care team how to get rid of this medication safely. NOTE: This sheet is a summary. It may not cover all possible information. If you have questions about this medicine, talk to your doctor, pharmacist, or health care provider.  2024 Elsevier/Gold Standard (2022-12-30 00:00:00)

## 2023-03-27 NOTE — Progress Notes (Signed)
 Subjective:  Patient ID: Brian Mcgee, male    DOB: 1960/09/14  Age: 63 y.o. MRN: [id number]  CC: Hypertension, Hypothyroidism, and Hyperlipidemia   HPI JOHNNATHAN Mcgee presents for f/up ----  Discussed the use of AI scribe software for clinical note transcription with the patient, who gave verbal consent to proceed.  History of Present Illness   Brian Mcgee is a 62 year old male who presents with concerns about declining kidney function and medication management.  He has experienced a decline in kidney function over recent months, with levels dropping from 87 in September 2023 to the 70s, and most recently to 47, which he understands to be stage 2. He is concerned about this decline and questions if it is related to his medication use.  He has been taking esomeprazole (Nexium) for acid reflux for a long time and has been attempting to limit its use, with mixed success. He does not take any other medications that could potentially harm his kidneys, such as anti-inflammatories, and does not take any pain medications or vitamins.  He reports variability in urinary flow, which he associates with a previously diagnosed enlarged prostate. He is attentive to these symptoms due to his concerns about kidney function.  He inquires about eligibility for weight loss medication, noting his BMI is 36 and is interested in exploring this option due to its potential medical benefits.  No chest pain, shortness of breath, dizziness, or lightheadedness.       Outpatient Medications Prior to Visit  Medication Sig Dispense Refill   aspirin 81 MG tablet Take 81 mg by mouth daily.     esomeprazole (NEXIUM) 40 MG capsule Take 1 capsule (40 mg total) by mouth daily. (Patient taking differently: Take 40 mg by mouth as needed.) 90 capsule 1   indapamide (LOZOL) 1.25 MG tablet Take 1 tablet (1.25 mg total) by mouth daily. 90 tablet 1   irbesartan (AVAPRO) 300 MG tablet Take 1 tablet (300 mg total) by  mouth daily. TAKE ONE TABLET BY MOUTH DAILY 90 tablet 1   levothyroxine (SYNTHROID) 150 MCG tablet Take 1 tablet (150 mcg total) by mouth daily before breakfast. 90 tablet 1   rosuvastatin (CRESTOR) 20 MG tablet Take 1 tablet (20 mg total) by mouth daily. 90 tablet 1   sildenafil (REVATIO) 20 MG tablet Take 1 tablet (20 mg total) by mouth daily as needed. Take 1-3 tablets daily as needed 30 tablet 2   OVER THE COUNTER MEDICATION CPAP therapy (Patient not taking: Reported on 03/27/2023)     nebivolol (BYSTOLIC) 5 MG tablet Take 1 tablet (5 mg total) by mouth daily. 90 tablet 1   No facility-administered medications prior to visit.    ROS Review of Systems  Constitutional:  Positive for unexpected weight change (wt gain). Negative for appetite change, chills, diaphoresis and fatigue.  HENT:  Negative for congestion.   Eyes:  Negative for visual disturbance.  Respiratory: Negative.  Negative for cough, chest tightness, shortness of breath and wheezing.   Cardiovascular:  Negative for chest pain, palpitations and leg swelling.  Gastrointestinal:  Negative for abdominal pain, constipation, diarrhea, nausea and vomiting.  Genitourinary: Negative.  Negative for difficulty urinating.  Musculoskeletal: Negative.  Negative for arthralgias, back pain, myalgias and neck pain.  Skin: Negative.   Neurological: Negative.  Negative for dizziness and weakness.  Hematological:  Negative for adenopathy. Does not bruise/bleed easily.  Psychiatric/Behavioral: Negative.      Objective:  BP 132/86 (BP Location:  Left Arm, Patient Position: Sitting, Cuff Size: Normal)   Pulse 74   Temp (!) 97.5 F (36.4 C) (Oral)   Resp 16   Ht 5\' 10"  (1.778 m)   Wt 256 lb 6.4 oz (116.3 kg)   SpO2 96%   BMI 36.79 kg/m   BP Readings from Last 3 Encounters:  03/27/23 132/86  10/26/22 (!) 142/88  10/05/22 126/76    Wt Readings from Last 3 Encounters:  03/27/23 256 lb 6.4 oz (116.3 kg)  10/26/22 251 lb (113.9 kg)   10/05/22 246 lb (111.6 kg)    Physical Exam Vitals reviewed.  Constitutional:      Appearance: Normal appearance.  Eyes:     General: No scleral icterus.    Conjunctiva/sclera: Conjunctivae normal.  Cardiovascular:     Rate and Rhythm: Normal rate and regular rhythm.     Heart sounds: No murmur heard.    No friction rub. No gallop.  Pulmonary:     Effort: Pulmonary effort is normal.     Breath sounds: No stridor. No wheezing, rhonchi or rales.  Abdominal:     General: Abdomen is flat.     Palpations: There is no mass.     Tenderness: There is no abdominal tenderness. There is no guarding.     Hernia: No hernia is present.  Musculoskeletal:        General: Normal range of motion.     Cervical back: Neck supple.     Right lower leg: No edema.     Left lower leg: No edema.  Lymphadenopathy:     Cervical: No cervical adenopathy.  Skin:    General: Skin is warm and dry.  Neurological:     General: No focal deficit present.     Mental Status: He is alert. Mental status is at baseline.  Psychiatric:        Mood and Affect: Mood normal.        Behavior: Behavior normal.     Lab Results  Component Value Date   WBC 5.9 03/27/2023   HGB 15.4 03/27/2023   HCT 44.9 03/27/2023   PLT 234.0 03/27/2023   GLUCOSE 116 (H) 03/27/2023   CHOL 123 03/27/2023   TRIG 198.0 (H) 03/27/2023   HDL 36.90 (L) 03/27/2023   LDLCALC 46 03/27/2023   ALT 16 03/27/2023   AST 16 03/27/2023   NA 141 03/27/2023   K 4.2 03/27/2023   CL 103 03/27/2023   CREATININE 1.21 03/27/2023   BUN 18 03/27/2023   CO2 26 03/27/2023   TSH 2.70 03/27/2023   PSA 2.91 10/26/2022   HGBA1C 5.6 03/09/2022    No results found.  Assessment & Plan:   Essential hypertension- His BP is adequately well-controlled. -     Basic metabolic panel; Future -     CBC with Differential/Platelet; Future -     TSH; Future -     Urinalysis, Routine w reflex microscopic; Future -     Hepatic function panel;  Future  Acquired hypothyroidism- He is euthyroid. -     CBC with Differential/Platelet; Future -     TSH; Future  Dyslipidemia, goal LDL below 100- Will risk stratify with a coronary calcium score.  -     Lipid panel; Future -     Hepatic function panel; Future -     CT CARDIAC SCORING (SELF PAY ONLY); Future  Class 2 severe obesity due to excess calories with serious comorbidity and body mass index (BMI) of  36.0 to 36.9 in adult [facility]) -     Tirzepatide; Inject 2.5 mg into the skin once a week.  Dispense: 2 mL; Refill: 0  Benign prostatic hyperplasia with urinary hesitancy -     PSA; Future     Follow-up: Return in about 6 months (around 09/24/2023).  Sanda Linger, MD

## 2023-03-27 NOTE — Telephone Encounter (Signed)
 Called patient to advise him he needed to come back in the office to have his PSA checked. Was unable to reach him. LMTRC. If patient calls back advise him to come in at his earliest convenience to have his PSA checked. The lab orders is placed, the lab has been made aware.

## 2023-03-30 ENCOUNTER — Ambulatory Visit: Payer: Self-pay | Admitting: Internal Medicine

## 2023-04-03 ENCOUNTER — Encounter: Payer: Self-pay | Admitting: Internal Medicine

## 2023-04-04 ENCOUNTER — Ambulatory Visit: Payer: BC Managed Care – PPO | Admitting: Internal Medicine

## 2023-04-14 ENCOUNTER — Telehealth: Payer: Self-pay | Admitting: Pharmacy Technician

## 2023-04-14 ENCOUNTER — Other Ambulatory Visit (HOSPITAL_COMMUNITY): Payer: Self-pay

## 2023-04-14 NOTE — Telephone Encounter (Signed)
 Pharmacy Patient Advocate Encounter   Received notification from Patient Pharmacy that prior authorization for [facility] 2.5MG /0.5ML AUTO-INJECTORS is required/requested.   Insurance verification completed.   The patient is insured through [facility] .   Per test claim:   Mounjaro (Tirzepatide) Pt MUST be diabetic (evidenced by A1C at or above 6.5%). Please advise.

## 2023-04-18 ENCOUNTER — Other Ambulatory Visit: Payer: Self-pay | Admitting: Internal Medicine

## 2023-04-18 DIAGNOSIS — I10 Essential (primary) hypertension: Secondary | ICD-10-CM

## 2023-04-18 DIAGNOSIS — E039 Hypothyroidism, unspecified: Secondary | ICD-10-CM

## 2023-04-18 DIAGNOSIS — R000 Tachycardia, unspecified: Secondary | ICD-10-CM

## 2023-04-18 NOTE — Telephone Encounter (Signed)
**Note De-identified  [place] Obfuscation** Please advise 

## 2023-04-18 NOTE — Telephone Encounter (Signed)
 Patient has been made aware. He is very frustrated and will call his insurance to see what they will cover.

## 2023-04-18 NOTE — Telephone Encounter (Signed)
 Greggory Keen is only approved for a diagnosis of type 2 diabetes. PA will be denied without documentation of diabetes. PA not submitted.

## 2023-04-18 NOTE — Telephone Encounter (Signed)
 Brian Mcgee is for type 2 diabetes. Zepbound is for weight management.

## 2023-04-18 NOTE — Telephone Encounter (Signed)
 Obesity wont get it approved ?

## 2023-04-18 NOTE — Telephone Encounter (Signed)
 Noted.

## 2023-04-19 ENCOUNTER — Other Ambulatory Visit: Payer: Self-pay | Admitting: Internal Medicine

## 2023-04-19 DIAGNOSIS — E039 Hypothyroidism, unspecified: Secondary | ICD-10-CM

## 2023-04-19 MED ORDER — LEVOTHYROXINE SODIUM 150 MCG PO TABS: 150.0000 ug | ORAL_TABLET | Freq: Every day | ORAL | 1 refills | Status: DC

## 2023-04-20 ENCOUNTER — Other Ambulatory Visit: Payer: Self-pay | Admitting: Internal Medicine

## 2023-04-20 ENCOUNTER — Ambulatory Visit
Admission: RE | Admit: 2023-04-20 | Discharge: 2023-04-20 | Disposition: A | Discharge status: Home / Self Care | Payer: BC Managed Care – PPO | Source: Ambulatory Visit | Attending: Internal Medicine | Admitting: Internal Medicine

## 2023-04-20 DIAGNOSIS — E785 Hyperlipidemia, unspecified: Secondary | ICD-10-CM

## 2023-04-20 DIAGNOSIS — I10 Essential (primary) hypertension: Secondary | ICD-10-CM

## 2023-04-20 DIAGNOSIS — R000 Tachycardia, unspecified: Secondary | ICD-10-CM

## 2023-04-29 ENCOUNTER — Encounter: Payer: Self-pay | Admitting: Internal Medicine

## 2023-04-29 ENCOUNTER — Telehealth: Payer: Self-pay | Admitting: Internal Medicine

## 2023-04-29 DIAGNOSIS — I10 Essential (primary) hypertension: Secondary | ICD-10-CM

## 2023-04-29 DIAGNOSIS — R000 Tachycardia, unspecified: Secondary | ICD-10-CM

## 2023-05-01 NOTE — Telephone Encounter (Signed)
 Copied from CRM [id number]. Topic: Clinical - Medication Question >> May 01, 2023 11:06 AM [place] J wrote: Reason for CRM: Patient was wondering why Dr. Yetta Barre did not see it appropriate for a refill on nebivolol (BYSTOLIC) 5 MG tablet.

## 2023-05-04 ENCOUNTER — Other Ambulatory Visit: Payer: Self-pay | Admitting: Internal Medicine

## 2023-05-04 DIAGNOSIS — I10 Essential (primary) hypertension: Secondary | ICD-10-CM

## 2023-05-04 MED ORDER — NEBIVOLOL HCL 5 MG PO TABS: 5.0000 mg | ORAL_TABLET | Freq: Every day | ORAL | 0 refills | Status: DC

## 2023-05-04 NOTE — Telephone Encounter (Signed)
 Patient has been made aware that he is no longer supposed to be taking this medication. Dr. Yetta Barre discontinued it at his last office visit.

## 2023-05-25 ENCOUNTER — Other Ambulatory Visit: Payer: Self-pay | Admitting: Internal Medicine

## 2023-05-25 DIAGNOSIS — N529 Male erectile dysfunction, unspecified: Secondary | ICD-10-CM

## 2023-05-25 DIAGNOSIS — I10 Essential (primary) hypertension: Secondary | ICD-10-CM

## 2023-05-31 ENCOUNTER — Telehealth: Payer: Self-pay | Admitting: Internal Medicine

## 2023-05-31 DIAGNOSIS — I10 Essential (primary) hypertension: Secondary | ICD-10-CM

## 2023-05-31 DIAGNOSIS — N529 Male erectile dysfunction, unspecified: Secondary | ICD-10-CM

## 2023-05-31 DIAGNOSIS — E785 Hyperlipidemia, unspecified: Secondary | ICD-10-CM

## 2023-06-01 ENCOUNTER — Telehealth: Payer: Self-pay

## 2023-06-01 DIAGNOSIS — N529 Male erectile dysfunction, unspecified: Secondary | ICD-10-CM

## 2023-06-01 DIAGNOSIS — I10 Essential (primary) hypertension: Secondary | ICD-10-CM

## 2023-06-01 DIAGNOSIS — E785 Hyperlipidemia, unspecified: Secondary | ICD-10-CM

## 2023-06-01 MED ORDER — IRBESARTAN 300 MG PO TABS: 300.0000 mg | ORAL_TABLET | Freq: Every day | ORAL | 1 refills | Status: DC

## 2023-06-01 MED ORDER — SILDENAFIL CITRATE 20 MG PO TABS: 20.0000 mg | ORAL_TABLET | Freq: Every day | ORAL | 2 refills | Status: DC | PRN

## 2023-06-01 MED ORDER — ROSUVASTATIN CALCIUM 20 MG PO TABS
20.0000 mg | ORAL_TABLET | Freq: Every day | ORAL | 1 refills | Status: DC
Start: 2023-06-01 — End: 2023-12-06

## 2023-06-01 NOTE — Telephone Encounter (Signed)
 Copied from CRM [id number]. Topic: Clinical - Prescription Issue >> Jun 01, 2023 10:00 AM Dewanda Foots wrote: Reason for CRM: Pt states the following medications were not filled and he is leaving out of town today. He is upset and wanting this to be resolved before he leaves because he needs them for his trip. States this is not the first time this has happened and he is getting frustrated with this situation.  RX Requested 4/24 and again 4/30 Please advise.   rosuvastatin  (CRESTOR ) 20 MG tablet-was not signed sildenafil  (REVATIO ) 20 MG tablet-was not signed irbesartan  (AVAPRO ) 300 MG tablet-was not signed

## 2023-06-02 NOTE — Telephone Encounter (Signed)
 Patient has received his medications.

## 2023-06-02 NOTE — Telephone Encounter (Signed)
 Copied from CRM [id number]. Topic: Clinical - Medication Question >> Jun 02, 2023  3:29 PM Adonis Hoot wrote: Reason for CRM: [facility] called and would like clearer directions on medication sildenafil  (REVATIO ) 20 MG tablet. Says to take one,then take 1-3 daily   [id number] - [place], [place] - [address]  Phone: [phone] Fax: [phone]

## 2023-06-07 NOTE — Telephone Encounter (Signed)
 Directions has been clarified with the pharmacy.

## 2023-06-09 ENCOUNTER — Other Ambulatory Visit: Payer: Self-pay | Admitting: Internal Medicine

## 2023-06-09 ENCOUNTER — Encounter: Payer: Self-pay | Admitting: Internal Medicine

## 2023-06-09 DIAGNOSIS — I10 Essential (primary) hypertension: Secondary | ICD-10-CM

## 2023-08-11 ENCOUNTER — Other Ambulatory Visit: Payer: Self-pay

## 2023-08-11 ENCOUNTER — Encounter: Payer: Self-pay | Admitting: Internal Medicine

## 2023-08-11 DIAGNOSIS — I10 Essential (primary) hypertension: Secondary | ICD-10-CM

## 2023-08-11 DIAGNOSIS — K219 Gastro-esophageal reflux disease without esophagitis: Secondary | ICD-10-CM

## 2023-08-11 MED ORDER — NEBIVOLOL HCL 5 MG PO TABS: 5.0000 mg | ORAL_TABLET | Freq: Every day | ORAL | 0 refills | Status: DC

## 2023-08-11 MED ORDER — ESOMEPRAZOLE MAGNESIUM 40 MG PO CPDR: 40.0000 mg | DELAYED_RELEASE_CAPSULE | Freq: Every day | ORAL | 0 refills | Status: DC

## 2023-08-15 DIAGNOSIS — H524 Presbyopia: Secondary | ICD-10-CM | POA: Diagnosis not present

## 2023-08-15 DIAGNOSIS — H52203 Unspecified astigmatism, bilateral: Secondary | ICD-10-CM | POA: Diagnosis not present

## 2023-08-28 ENCOUNTER — Other Ambulatory Visit: Payer: Self-pay | Admitting: Internal Medicine

## 2023-08-28 DIAGNOSIS — I10 Essential (primary) hypertension: Secondary | ICD-10-CM

## 2023-08-28 MED ORDER — INDAPAMIDE 1.25 MG PO TABS: 1.2500 mg | ORAL_TABLET | Freq: Every day | ORAL | 0 refills | Status: DC

## 2023-09-26 ENCOUNTER — Encounter: Payer: Self-pay | Admitting: Family Medicine

## 2023-09-26 ENCOUNTER — Ambulatory Visit: Payer: Self-pay | Admitting: Family Medicine

## 2023-09-26 VITALS — BP 140/90 | HR 79 | Temp 98.7°F | Ht 70.0 in | Wt 244.6 lb

## 2023-09-26 DIAGNOSIS — R051 Acute cough: Secondary | ICD-10-CM | POA: Diagnosis not present

## 2023-09-26 DIAGNOSIS — I10 Essential (primary) hypertension: Secondary | ICD-10-CM | POA: Diagnosis not present

## 2023-09-26 DIAGNOSIS — B9689 Other specified bacterial agents as the cause of diseases classified elsewhere: Secondary | ICD-10-CM | POA: Diagnosis not present

## 2023-09-26 DIAGNOSIS — J069 Acute upper respiratory infection, unspecified: Secondary | ICD-10-CM

## 2023-09-26 LAB — POCT INFLUENZA A/B
Influenza A, POC: NEGATIVE
Influenza B, POC: NEGATIVE

## 2023-09-26 LAB — POC COVID19 BINAXNOW: SARS Coronavirus 2 Ag: NEGATIVE

## 2023-09-26 MED ORDER — AZITHROMYCIN 250 MG PO TABS: ORAL_TABLET | ORAL | 0 refills | Status: AC

## 2023-09-26 NOTE — Progress Notes (Signed)
 Acute Office Visit  Subjective:     Patient ID: Brian Mcgee, male    DOB: 06/07/1960, 63 y.o.   MRN: [id number]  Chief Complaint  Patient presents with   Acute Visit    Started on the 22nd, came back from international traveling. Sore throat, nasal and chest congestion, headaches, green and yellow colored mucus    HPI  Discussed the use of AI scribe software for clinical note transcription with the patient, who gave verbal consent to proceed.  History of Present Illness Brian Mcgee is a 63 year old male who presents with upper respiratory symptoms after recent travel.  Upper respiratory symptoms - Onset September 22, 2023, after returning from [place] - Symptoms improve in the morning and worsen by nighttime, fatigue, headaches, sinus pressure - No current cough - Symptoms improved with Mucinex, but caused nausea and vomiting, likely due to inadequate hydration - Ibuprofen and Sudafed used; Sudafed alleviated severe head pressure during flight - Sudafed used sparingly due to concerns about blood pressure - Feeling better today, but symptoms worsened by nighttime in previous days     ROS Per HPI      Objective:    BP (!) 140/90   Pulse 79   Temp 98.7 F (37.1 C)   Ht 5' 10 (1.778 m)   Wt 244 lb 9.6 oz (110.9 kg)   SpO2 98%   BMI 35.10 kg/m    Physical Exam Vitals and nursing note reviewed.  Constitutional:      General: He is not in acute distress.    Appearance: Normal appearance.     Comments: Appears fatigued   HENT:     Head: Normocephalic and atraumatic.     Right Ear: External ear normal.     Left Ear: External ear normal.     Nose: Nose normal.     Comments: Voice sounds congested    Mouth/Throat:     Mouth: Mucous membranes are moist.     Comments: Oropharyngeal cobblestoning   Eyes:     Extraocular Movements: Extraocular movements intact.  Cardiovascular:     Rate and Rhythm: Normal rate and regular rhythm.     Pulses: Normal  pulses.     Heart sounds: Normal heart sounds. No murmur heard. Pulmonary:     Effort: Pulmonary effort is normal. No respiratory distress.     Breath sounds: Normal breath sounds. No wheezing, rhonchi or rales.  Musculoskeletal:        General: Normal range of motion.     Cervical back: Normal range of motion.     Right lower leg: No edema.     Left lower leg: No edema.  Lymphadenopathy:     Cervical: Cervical adenopathy present.  Skin:    General: Skin is warm and dry.  Neurological:     General: No focal deficit present.     Mental Status: He is alert and oriented to person, place, and time.  Psychiatric:        Mood and Affect: Mood normal.        Behavior: Behavior normal.     No results found for any visits on 09/26/23.      Assessment & Plan:   Assessment and Plan Assessment & Plan Acute upper respiratory infection Acute upper respiratory infection likely from recent travel. Symptoms improving. Previous pneumonia post-travel increases risk. Differential includes bacterial infection; azithromycin  considered due to penicillin allergy. - Prescribe azithromycin : 2 tablets on the first day, then  1 tablet daily for the next 4 days if symptoms worsen by tonight. - Recommend plain Mucinex and Coricidin HBP for symptom relief. - START azithromycin  on Thursday with no improvement in symptoms  Essential hypertension Essential hypertension managed with current medication. Sudafed taken during travel, aware of potential to elevate blood pressure. - Recommend Coricidin HBP for cold symptoms.     Orders Placed This Encounter  Procedures   POC COVID-19 BinaxNow   POCT Influenza A/B     Meds ordered this encounter  Medications   azithromycin  (ZITHROMAX ) 250 MG tablet    Sig: Take 2 tablets on day 1, then 1 tablet daily on days 2 through 5    Dispense:  6 tablet    Refill:  0    Return if symptoms worsen or fail to improve.  Corean LITTIE Ku, FNP

## 2023-09-26 NOTE — Patient Instructions (Addendum)
 I have sent in azithromycin  for you to take.  Take 2 tablets today, then 1 tablet daily for the next 4 days.  Plain mucinex and Coricidin HBP for symptomatic relief.   Follow-up with me for new or worsening symptoms.

## 2023-10-09 IMAGING — DX DG ELBOW COMPLETE 3+V*L*
4 series · 4 of 4 positions shown · non-contrast
Comparison: None Available.

CLINICAL DATA: pain for 6 months

EXAM:
LEFT ELBOW - COMPLETE 3+ VIEW

[elbow ap]
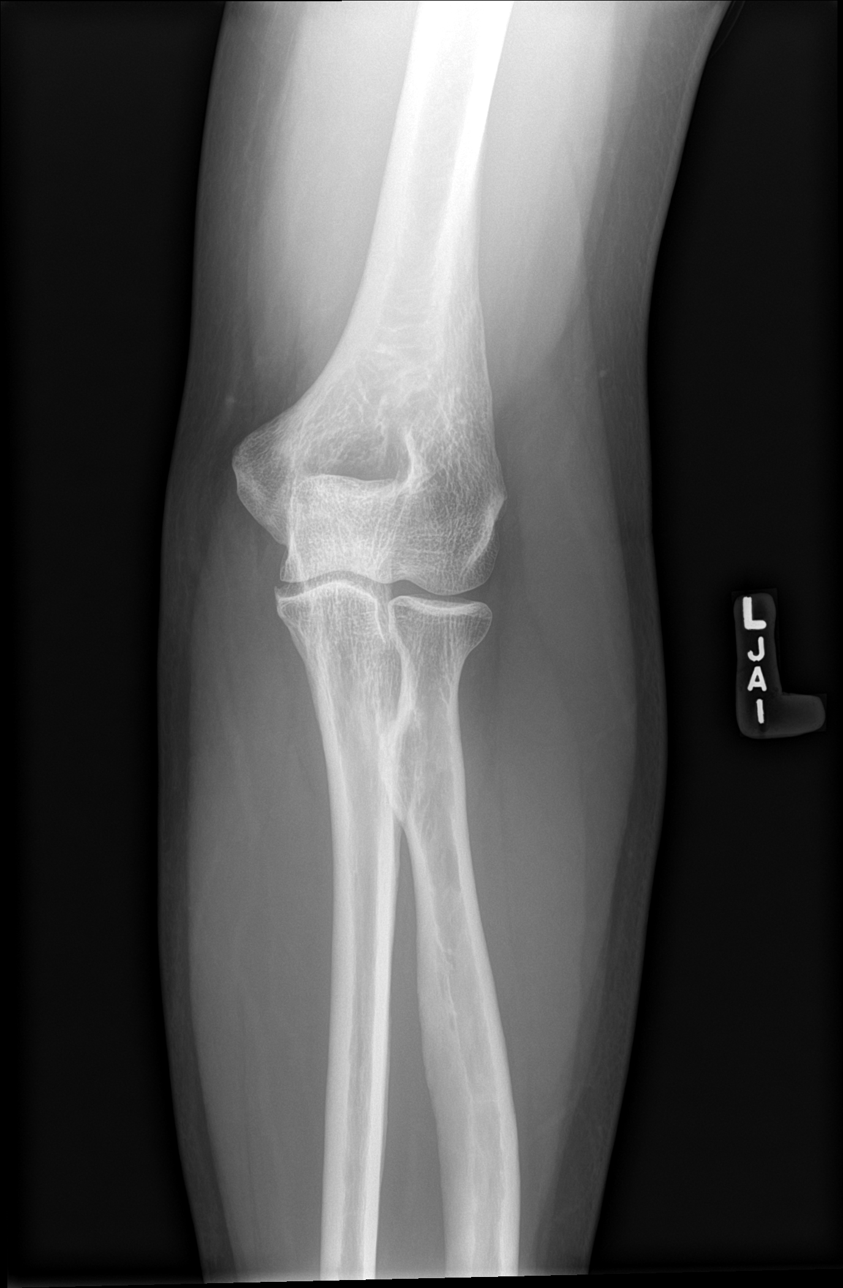

[elbow obl (1 of 2)]
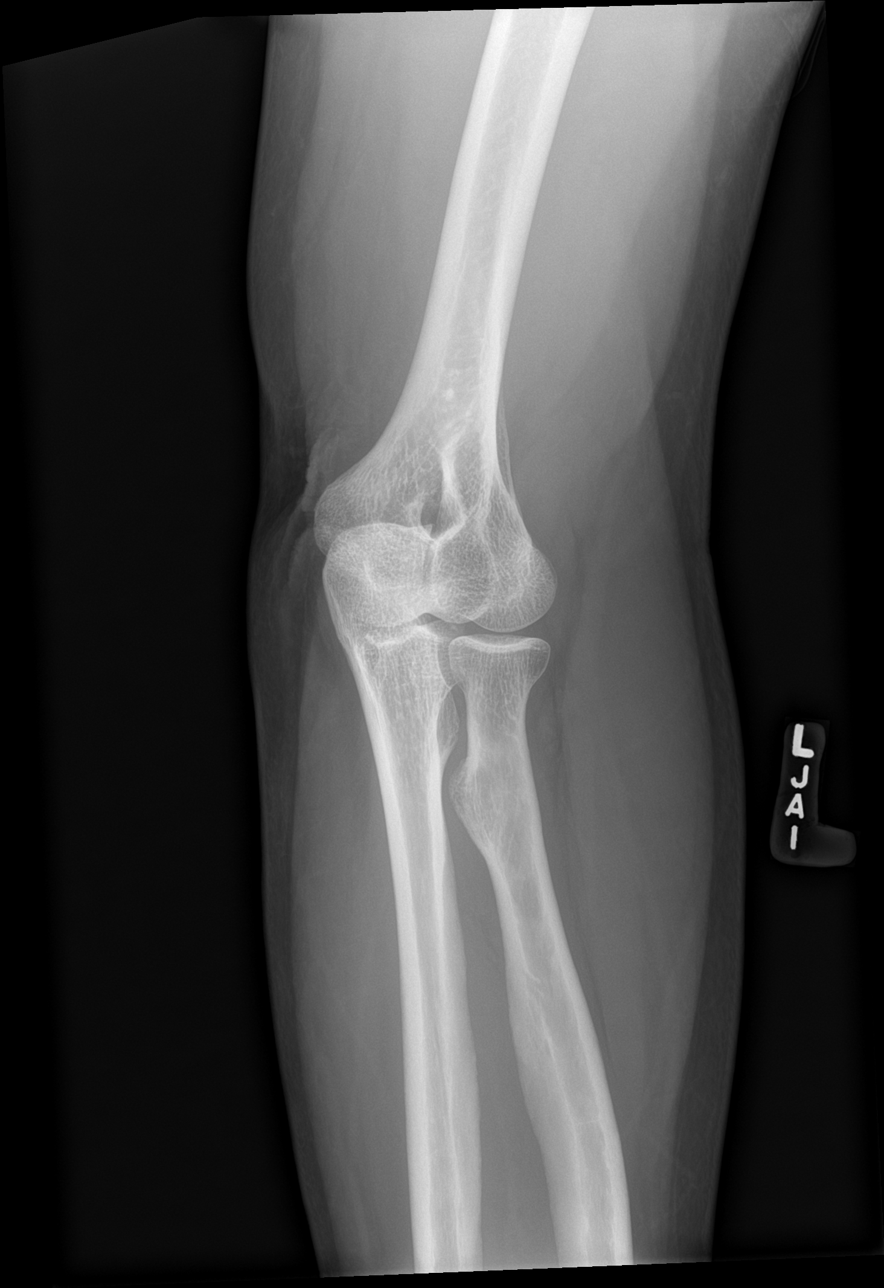

[elbow obl (2 of 2)]
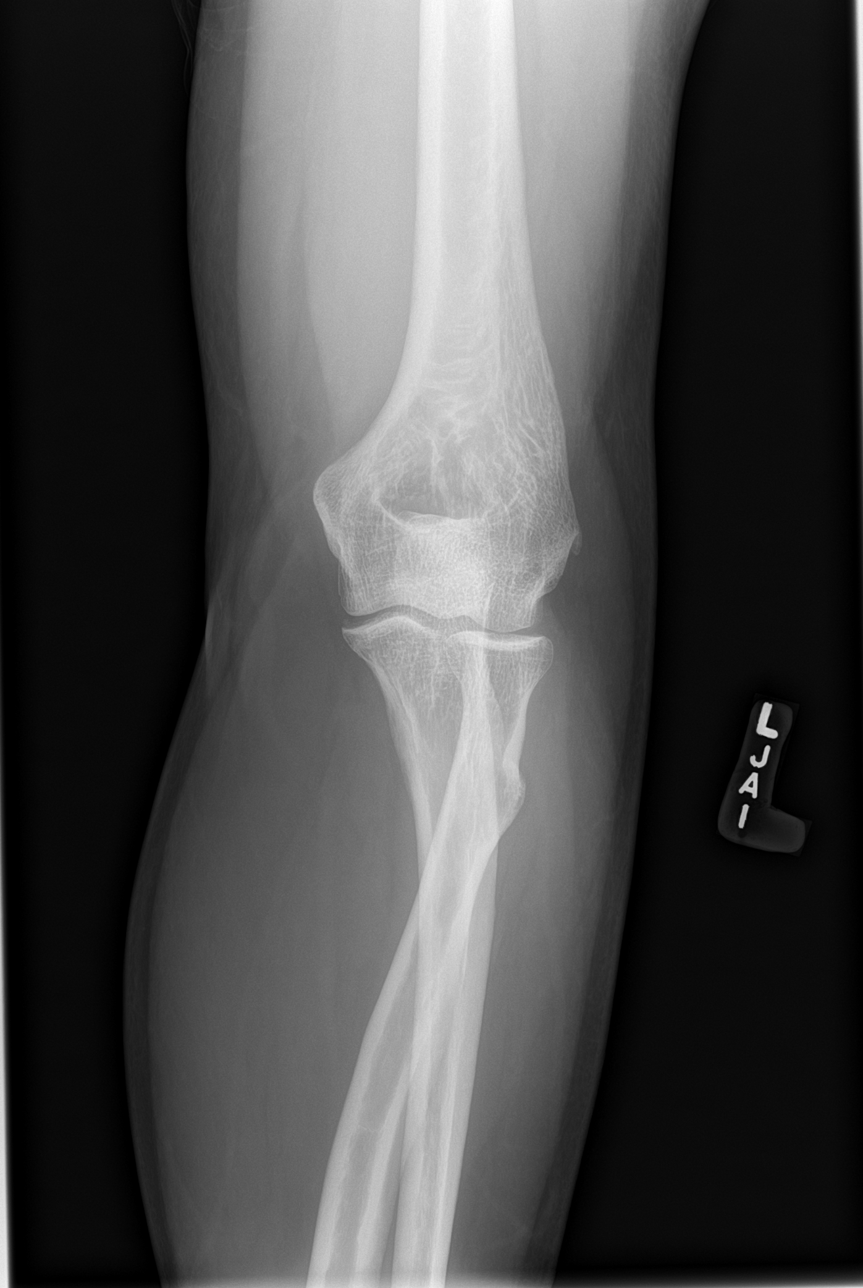

[elbow lat]
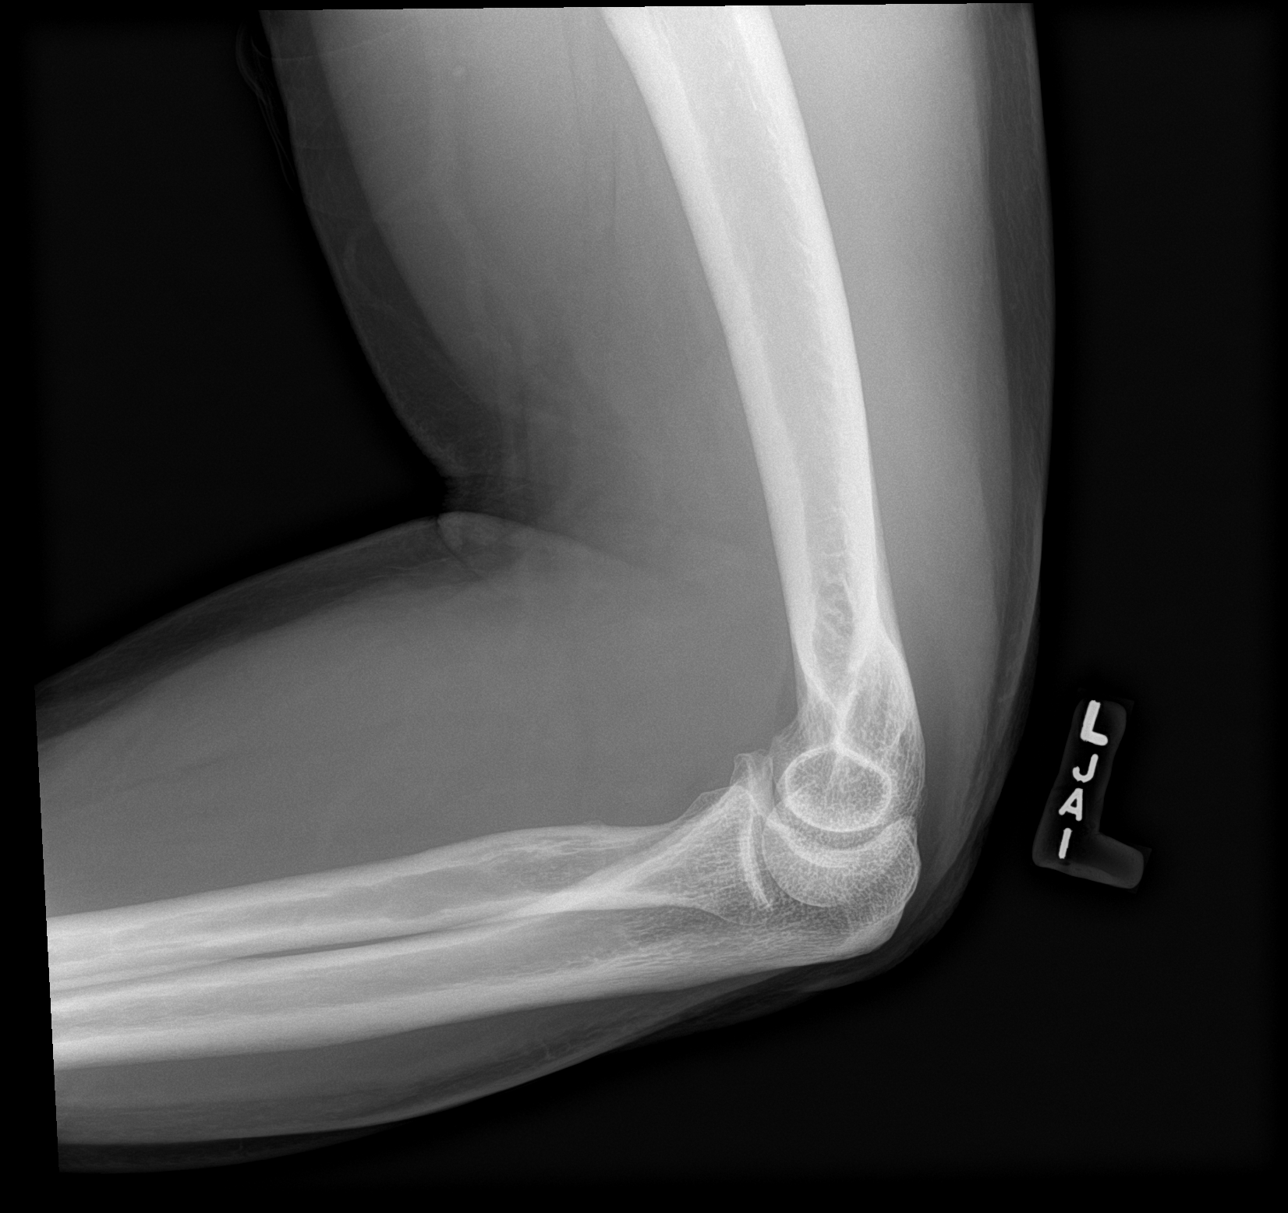

[4 of 4 positions shown; findings below may reference images not displayed]

FINDINGS: Normal alignment. No acute fracture. Normal mineralization. The soft
tissues are unremarkable. No joint effusion.
IMPRESSION: Normal elbow radiograph.

## 2023-11-01 ENCOUNTER — Encounter: Payer: Self-pay | Admitting: Internal Medicine

## 2023-11-02 ENCOUNTER — Other Ambulatory Visit: Payer: Self-pay

## 2023-11-02 DIAGNOSIS — E039 Hypothyroidism, unspecified: Secondary | ICD-10-CM

## 2023-11-02 MED ORDER — LEVOTHYROXINE SODIUM 150 MCG PO TABS: 150.0000 ug | ORAL_TABLET | Freq: Every day | ORAL | 0 refills | Status: DC

## 2023-11-07 NOTE — Telephone Encounter (Unsigned)
 Copied from CRM #[id number]. Topic: Clinical - Medication Refill >> Nov 07, 2023  4:16 PM Harlene ORN wrote: Medication: nebivolol  (BYSTOLIC ) 5 MG tablet (90 day supply)  Has the patient contacted their pharmacy? Yes (Agent: If no, request that the patient contact the pharmacy for the refill. If patient does not wish to contact the pharmacy document the reason why and proceed with request.) (Agent: If yes, when and what did the pharmacy advise?)  This is the patient's preferred pharmacy:  [facility] [address], [place] - [address] [place] [address] Phone: [phone] Fax: [phone]  Is this the correct pharmacy for this prescription? Yes If no, delete pharmacy and type the correct one.   Has the prescription been filled recently? No  Is the patient out of the medication? No  Has the patient been seen for an appointment in the last year OR does the patient have an upcoming appointment? Yes  Can we respond through MyChart? Yes  Agent: Please be advised that Rx refills may take up to 3 business days. We ask that you follow-up with your pharmacy.

## 2023-11-08 ENCOUNTER — Other Ambulatory Visit: Payer: Self-pay | Admitting: Internal Medicine

## 2023-11-08 DIAGNOSIS — I10 Essential (primary) hypertension: Secondary | ICD-10-CM

## 2023-11-21 ENCOUNTER — Encounter: Payer: Self-pay | Admitting: Internal Medicine

## 2023-11-21 ENCOUNTER — Other Ambulatory Visit: Payer: Self-pay

## 2023-11-21 DIAGNOSIS — K219 Gastro-esophageal reflux disease without esophagitis: Secondary | ICD-10-CM

## 2023-11-21 DIAGNOSIS — E039 Hypothyroidism, unspecified: Secondary | ICD-10-CM

## 2023-11-21 DIAGNOSIS — I10 Essential (primary) hypertension: Secondary | ICD-10-CM

## 2023-11-21 MED ORDER — ESOMEPRAZOLE MAGNESIUM 40 MG PO CPDR: 40.0000 mg | DELAYED_RELEASE_CAPSULE | Freq: Every day | ORAL | 0 refills | Status: DC

## 2023-11-21 MED ORDER — IRBESARTAN 300 MG PO TABS: 300.0000 mg | ORAL_TABLET | Freq: Every day | ORAL | 0 refills | Status: DC

## 2023-11-21 MED ORDER — LEVOTHYROXINE SODIUM 150 MCG PO TABS: 150.0000 ug | ORAL_TABLET | Freq: Every day | ORAL | 0 refills | Status: DC

## 2023-11-21 MED ORDER — IRBESARTAN 300 MG PO TABS
300.0000 mg | ORAL_TABLET | Freq: Every day | ORAL | 0 refills | Status: DC
Start: 2023-11-21 — End: 2023-11-21

## 2023-12-05 ENCOUNTER — Encounter: Payer: Self-pay | Admitting: Internal Medicine

## 2023-12-05 ENCOUNTER — Ambulatory Visit: Admitting: Internal Medicine

## 2023-12-05 VITALS — BP 138/88 | HR 60 | Temp 97.7°F | Resp 16 | Ht 70.0 in | Wt 254.0 lb

## 2023-12-05 DIAGNOSIS — Z23 Encounter for immunization: Secondary | ICD-10-CM

## 2023-12-05 DIAGNOSIS — E785 Hyperlipidemia, unspecified: Secondary | ICD-10-CM | POA: Diagnosis not present

## 2023-12-05 DIAGNOSIS — N529 Male erectile dysfunction, unspecified: Secondary | ICD-10-CM

## 2023-12-05 DIAGNOSIS — R739 Hyperglycemia, unspecified: Secondary | ICD-10-CM | POA: Diagnosis not present

## 2023-12-05 DIAGNOSIS — E039 Hypothyroidism, unspecified: Secondary | ICD-10-CM | POA: Diagnosis not present

## 2023-12-05 DIAGNOSIS — I10 Essential (primary) hypertension: Secondary | ICD-10-CM | POA: Diagnosis not present

## 2023-12-05 DIAGNOSIS — K219 Gastro-esophageal reflux disease without esophagitis: Secondary | ICD-10-CM | POA: Diagnosis not present

## 2023-12-05 DIAGNOSIS — R001 Bradycardia, unspecified: Secondary | ICD-10-CM

## 2023-12-05 LAB — LIPID PANEL
Cholesterol: 126 mg/dL (ref 0–200)
HDL: 42 mg/dL (ref >39.00)
LDL Cholesterol: 53 mg/dL (ref 0–99)
NonHDL: 83.63
Total CHOL/HDL Ratio: 3
Triglycerides: 155 mg/dL — ABNORMAL HIGH (ref 0.0–149.0)
VLDL: 31 mg/dL (ref 0.0–40.0)

## 2023-12-05 LAB — URINALYSIS, ROUTINE W REFLEX MICROSCOPIC
Bilirubin Urine: NEGATIVE
Hgb urine dipstick: NEGATIVE
Ketones, ur: NEGATIVE
Nitrite: NEGATIVE
RBC / HPF: NONE SEEN (ref 0–?)
Specific Gravity, Urine: <=1.005 — AB (ref 1.000–1.030)
Total Protein, Urine: NEGATIVE
Urine Glucose: NEGATIVE
Urobilinogen, UA: 0.2 (ref 0.0–1.0)
pH: 6 (ref 5.0–8.0)

## 2023-12-05 LAB — BASIC METABOLIC PANEL WITH GFR
BUN: 14 mg/dL (ref 6–23)
CO2: 28 meq/L (ref 19–32)
Calcium: 9.1 mg/dL (ref 8.4–10.5)
Chloride: 102 meq/L (ref 96–112)
Creatinine, Ser: 1.11 mg/dL (ref 0.40–1.50)
GFR: 71.55 mL/min (ref >60.00)
Glucose, Bld: 94 mg/dL (ref 70–99)
Potassium: 3.9 meq/L (ref 3.5–5.1)
Sodium: 140 meq/L (ref 135–145)

## 2023-12-05 LAB — CBC WITH DIFFERENTIAL/PLATELET
Basophils Absolute: 0 K/uL (ref 0.0–0.1)
Basophils Relative: 0.6 % (ref 0.0–3.0)
Eosinophils Absolute: 0.2 K/uL (ref 0.0–0.7)
Eosinophils Relative: 4 % (ref 0.0–5.0)
HCT: 44.2 % (ref 39.0–52.0)
Hemoglobin: 15 g/dL (ref 13.0–17.0)
Lymphocytes Relative: 42 % (ref 12.0–46.0)
Lymphs Abs: 1.8 K/uL (ref 0.7–4.0)
MCHC: 33.9 g/dL (ref 30.0–36.0)
MCV: 92 fl (ref 78.0–100.0)
Monocytes Absolute: 0.5 K/uL (ref 0.1–1.0)
Monocytes Relative: 11.7 % (ref 3.0–12.0)
Neutro Abs: 1.8 K/uL (ref 1.4–7.7)
Neutrophils Relative %: 41.7 % — ABNORMAL LOW (ref 43.0–77.0)
Platelets: 232 K/uL (ref 150.0–400.0)
RBC: 4.81 Mil/uL (ref 4.22–5.81)
RDW: 14 % (ref 11.5–15.5)
WBC: 4.4 K/uL (ref 4.0–10.5)

## 2023-12-05 LAB — HEMOGLOBIN A1C: Hgb A1c MFr Bld: 5.8 % (ref 4.6–6.5)

## 2023-12-05 NOTE — Progress Notes (Unsigned)
 Subjective:  Patient ID: Brian Mcgee, male    DOB: 1960-09-26  Age: 63 y.o. MRN: [id number]  CC: Hypertension, Hypothyroidism, and Gastroesophageal Reflux   HPI Brian Mcgee presents for f/up ---  Discussed the use of AI scribe software for clinical note transcription with the patient, who gave verbal consent to proceed.  History of Present Illness Brian Mcgee is a 63 year old male who presents for a routine follow-up visit.  He has experienced a weight gain of about ten pounds over the last month and a half, which he attributes to traveling, poor dietary habits, and reduced physical activity when not in [place] . While in [place] , he walks between four and six and a half miles a day, which he finds beneficial for his endurance. No chest pain or shortness of breath during these activities.  He has a history of HypoT, with weight gain being the only current symptom. No constipation or diarrhea. He is not using the prescribed weight loss medication due to its cost and prefers to manage his weight through diet and exercise.     Outpatient Medications Prior to Visit  Medication Sig Dispense Refill   aspirin 81 MG tablet Take 81 mg by mouth daily.     esomeprazole  (NEXIUM ) 40 MG capsule Take 1 capsule (40 mg total) by mouth daily. 90 capsule 0   indapamide  (LOZOL ) 1.25 MG tablet Take 1 tablet (1.25 mg total) by mouth daily. 90 tablet 0   irbesartan  (AVAPRO ) 300 MG tablet Take 1 tablet (300 mg total) by mouth daily. TAKE ONE TABLET BY MOUTH DAILY 90 tablet 0   levothyroxine  (SYNTHROID ) 150 MCG tablet Take 1 tablet (150 mcg total) by mouth daily before breakfast. 90 tablet 0   rosuvastatin  (CRESTOR ) 20 MG tablet Take 1 tablet (20 mg total) by mouth daily. 90 tablet 1   sildenafil  (REVATIO ) 20 MG tablet Take 1 tablet (20 mg total) by mouth daily as needed. Take 1-3 tablets daily as needed 30 tablet 2   nebivolol  (BYSTOLIC ) 5 MG tablet Take 1 tablet (5 mg total) by mouth daily  90 tablet 0   No facility-administered medications prior to visit.    ROS Review of Systems  Constitutional:  Positive for unexpected weight change (wt gain). Negative for chills, diaphoresis and fatigue.  HENT: Negative.    Eyes: Negative.   Respiratory:  Positive for apnea. Negative for cough, chest tightness, shortness of breath and wheezing.   Cardiovascular:  Negative for chest pain, palpitations and leg swelling.  Gastrointestinal: Negative.  Negative for abdominal pain, constipation, diarrhea, nausea and vomiting.  Endocrine: Negative.   Genitourinary: Negative.  Negative for difficulty urinating.  Musculoskeletal:  Positive for arthralgias. Negative for myalgias.  Neurological:  Negative for dizziness, weakness and light-headedness.  Hematological:  Negative for adenopathy. Does not bruise/bleed easily.  Psychiatric/Behavioral: Negative.      Objective:  BP 138/88 (BP Location: Left Arm, Patient Position: Sitting, Cuff Size: Large)   Pulse 60   Temp 97.7 F (36.5 C) (Temporal)   Resp 16   Ht 5' 10 (1.778 m)   Wt 254 lb (115.2 kg)   SpO2 98%   BMI 36.45 kg/m   BP Readings from Last 3 Encounters:  12/05/23 138/88  09/26/23 (!) 140/90  03/27/23 132/86    Wt Readings from Last 3 Encounters:  12/05/23 254 lb (115.2 kg)  09/26/23 244 lb 9.6 oz (110.9 kg)  03/27/23 256 lb 6.4 oz (116.3 kg)    Physical  Exam Cardiovascular:     Rate and Rhythm: Bradycardia present.     Comments: EKG--- SB with 1 st degree AV block, 52 bpm No LVH, Q waves, or ST/T wave changes  Abdominal:     General: Abdomen is protuberant. Bowel sounds are normal. There is no distension.     Palpations: Abdomen is soft. There is no hepatomegaly, splenomegaly or mass.  Musculoskeletal:     Right lower leg: No edema.     Left lower leg: No edema.     Lab Results  Component Value Date   WBC 5.9 03/27/2023   HGB 15.4 03/27/2023   HCT 44.9 03/27/2023   PLT 234.0 03/27/2023   GLUCOSE 116  (H) 03/27/2023   CHOL 123 03/27/2023   TRIG 198.0 (H) 03/27/2023   HDL 36.90 (L) 03/27/2023   LDLCALC 46 03/27/2023   ALT 16 03/27/2023   AST 16 03/27/2023   NA 141 03/27/2023   K 4.2 03/27/2023   CL 103 03/27/2023   CREATININE 1.21 03/27/2023   BUN 18 03/27/2023   CO2 26 03/27/2023   TSH 2.70 03/27/2023   PSA 2.14 03/27/2023   HGBA1C 5.6 03/09/2022    CT CARDIAC SCORING ([facility] LOCATIONS ONLY) Result Date: 04/28/2023 CLINICAL DATA:  Family history.  High cholesterol, hypertension * Tracking Code: FCC * EXAM: CT CARDIAC CORONARY ARTERY CALCIUM  SCORE TECHNIQUE: Non-contrast imaging through the heart was performed using prospective ECG gating. Image post processing was performed on an independent workstation, allowing for quantitative analysis of the heart and coronary arteries. Note that this exam targets the heart and the chest was not imaged in its entirety. COMPARISON:  None available. FINDINGS: CORONARY CALCIUM  SCORES: Left Main: 0 LAD: 0 LCx: 0 RCA/PDA: 0 Total Agatston Score: 0 MESA database percentile: 0 AORTA MEASUREMENTS: Ascending Aorta: 3.7 cm Descending Aorta:2.6 cm OTHER FINDINGS: Heart is normal size. Aorta normal caliber. No adenopathy. No confluent opacities or effusions. Moderate-sized hiatal hernia. No acute findings in the upper abdomen. Chest wall soft tissues are unremarkable. No acute bony abnormality. IMPRESSION: No visible coronary artery calcifications. Total coronary calcium  score of 0. Moderate-sized hiatal hernia. No acute extra cardiac abnormality. Electronically Signed   By: Franky Crease M.D.   On: 04/28/2023 21:29    Assessment & Plan:  Essential hypertension -     TSH; Future -     CBC with Differential/Platelet; Future -     Basic metabolic panel with GFR; Future -     Urinalysis, Routine w reflex microscopic; Future -     EKG 12-Lead  Acquired hypothyroidism -     TSH; Future -     CBC with Differential/Platelet; Future  Chronic hyperglycemia -      Hemoglobin A1c; Future -     Basic metabolic panel with GFR; Future -     Urinalysis, Routine w reflex microscopic; Future  Gastroesophageal reflux disease without esophagitis  Dyslipidemia, goal LDL below 100 -     Lipid panel; Future  Immunization due -     Pneumococcal conjugate vaccine 20-valent -     Flu vaccine trivalent PF, 6mos and older(Flulaval,Afluria,Fluarix,Fluzone)  Bradycardia with 51-60 beats per minute     Follow-up: Return in about 6 months (around 06/03/2024).  Debby Molt, MD

## 2023-12-05 NOTE — Patient Instructions (Signed)
 Bradycardia, Adult Bradycardia is a slower-than-normal heartbeat. A normal resting heart rate for an adult ranges from 60 to 100 beats per minute. With bradycardia, the resting heart rate is less than 60 beats per minute. Bradycardia can prevent enough oxygen  from reaching certain areas of your body when you are active. It can be serious if it keeps enough oxygen  from reaching your brain and other parts of your body. Bradycardia is not a problem for everyone. For some healthy adults, a slow resting heart rate is normal. What are the causes? This condition may be caused by: A problem with the heart, including: A problem with the heart's electrical system, such as a heart block. With a heart block, electrical signals between the chambers of the heart are partially or completely blocked, so they are not able to work as they should. A problem with the heart's natural pacemaker (sinus node). Heart disease. A heart attack. Heart damage. Lyme disease. A heart infection. A heart condition that is present at birth (congenital heart defect). Certain medicines that treat heart conditions. Certain conditions, such as hypothyroidism and obstructive sleep apnea. Problems with the balance of chemicals and other substances, like potassium, in the blood. Trauma. Radiation therapy. What increases the risk? You are more likely to develop this condition if you: Are age 30 or older. Have high blood pressure (hypertension), high cholesterol (hyperlipidemia), or diabetes. Drink heavily, use tobacco or nicotine products, or use drugs. What are the signs or symptoms? Symptoms of this condition include: Light-headedness. Feeling faint or fainting. Fatigue and weakness. Trouble with activity or exercise. Shortness of breath. Chest pain (angina). Drowsiness. Confusion. Dizziness. How is this diagnosed? This condition may be diagnosed based on: Your symptoms. Your medical history. A physical exam. During  the exam, your health care provider will listen to your heartbeat and check your pulse. To confirm the diagnosis, your health care provider may order tests, such as: Blood tests. An electrocardiogram (ECG). This test records the heart's electrical activity. The test can show how fast your heart is beating and whether the heartbeat is steady. A test in which you wear a portable device (event recorder or Holter monitor) to record your heart's electrical activity while you go about your day. An exercise test. How is this treated? Treatment for this condition depends on the cause of the condition and how severe your symptoms are. Treatment may involve: Treatment of the underlying condition. Changing your medicines or how much medicine you take. Having a small, battery-operated device called a pacemaker implanted under the skin. When bradycardia occurs, this device can be used to increase your heart rate and help your heart beat in a regular rhythm. Follow these instructions at home: Lifestyle Manage any health conditions that contribute to bradycardia as told by your health care provider. Follow a heart-healthy diet. A nutrition specialist (dietitian) can help educate you about healthy food options and changes. Follow an exercise program that is approved by your health care provider. Maintain a healthy weight. Try to reduce or manage your stress, such as with yoga or meditation. If you need help reducing stress, ask your health care provider. Do not use any products that contain nicotine or tobacco. These products include cigarettes, chewing tobacco, and vaping devices, such as e-cigarettes. If you need help quitting, ask your health care provider. Do not use illegal drugs. Alcohol  use If you drink alcohol : Limit how much you have to: 0-1 drink a day for women who are not pregnant. 0-2 drinks a day  for men. Know how much alcohol  is in a drink. In the [place] one drink equals one 12 oz bottle of  beer (355 mL), one 5 oz glass of wine (148 mL), or one 1 oz glass of hard liquor (44 mL). General instructions Take over-the-counter and prescription medicines only as told by your health care provider. Keep all follow-up visits. This is important. How is this prevented? In some cases, bradycardia may be prevented by: Treating underlying medical problems. Stopping behaviors or medicines that can trigger the condition. Contact a health care provider if: You feel light-headed or dizzy. You almost faint. You feel weak or are easily fatigued during physical activity. You experience confusion or have memory problems. Get help right away if: You faint. You have chest pains or an irregular heartbeat (palpitations). You have trouble breathing. These symptoms may represent a serious problem that is an emergency. Do not wait to see if the symptoms will go away. Get medical help right away. Call your local emergency services (911 in the [place] Do not drive yourself to the hospital. Summary Bradycardia is a slower-than-normal heartbeat. With bradycardia, the resting heart rate is less than 60 beats per minute. Treatment for this condition depends on the cause. Manage any health conditions that contribute to bradycardia as told by your health care provider. Do not use any products that contain nicotine or tobacco. These products include cigarettes, chewing tobacco, and vaping devices, such as e-cigarettes. Keep all follow-up visits. This is important. This information is not intended to replace advice given to you by your health care provider. Make sure you discuss any questions you have with your health care provider. Document Revised: 05/10/2020 Document Reviewed: 05/10/2020 Elsevier Patient Education  2024 [employer].

## 2023-12-06 ENCOUNTER — Ambulatory Visit: Payer: Self-pay | Admitting: Internal Medicine

## 2023-12-06 DIAGNOSIS — L821 Other seborrheic keratosis: Secondary | ICD-10-CM | POA: Diagnosis not present

## 2023-12-06 DIAGNOSIS — L570 Actinic keratosis: Secondary | ICD-10-CM | POA: Diagnosis not present

## 2023-12-06 DIAGNOSIS — L918 Other hypertrophic disorders of the skin: Secondary | ICD-10-CM | POA: Diagnosis not present

## 2023-12-06 DIAGNOSIS — Z85828 Personal history of other malignant neoplasm of skin: Secondary | ICD-10-CM | POA: Diagnosis not present

## 2023-12-06 DIAGNOSIS — L814 Other melanin hyperpigmentation: Secondary | ICD-10-CM | POA: Diagnosis not present

## 2023-12-06 LAB — TSH: TSH: 3.78 u[IU]/mL (ref 0.35–5.50)

## 2023-12-06 MED ORDER — LEVOTHYROXINE SODIUM 150 MCG PO TABS: 150.0000 ug | ORAL_TABLET | Freq: Every day | ORAL | 1 refills | Status: AC

## 2023-12-06 MED ORDER — IRBESARTAN 300 MG PO TABS: 300.0000 mg | ORAL_TABLET | Freq: Every day | ORAL | 1 refills | Status: AC

## 2023-12-06 MED ORDER — SILDENAFIL CITRATE 20 MG PO TABS: 20.0000 mg | ORAL_TABLET | Freq: Every day | ORAL | 2 refills | Status: AC | PRN

## 2023-12-06 MED ORDER — INDAPAMIDE 1.25 MG PO TABS: 1.2500 mg | ORAL_TABLET | Freq: Every day | ORAL | 1 refills | Status: AC

## 2023-12-06 MED ORDER — ESOMEPRAZOLE MAGNESIUM 40 MG PO CPDR: 40.0000 mg | DELAYED_RELEASE_CAPSULE | Freq: Every day | ORAL | 1 refills | Status: AC

## 2023-12-06 MED ORDER — ROSUVASTATIN CALCIUM 20 MG PO TABS: 20.0000 mg | ORAL_TABLET | Freq: Every day | ORAL | 1 refills | Status: AC

## 2024-06-12 ENCOUNTER — Ambulatory Visit: Admitting: Internal Medicine
